# Patient Record
Sex: Female | Born: 1950 | Race: White | Hispanic: No | Marital: Married | Smoking: Never smoker
Health system: Southern US, Community
[De-identification: ages and names within clinical notes are randomized; demographics above are authoritative.]

## PROBLEM LIST (undated history)

## (undated) DIAGNOSIS — E785 Hyperlipidemia, unspecified: Secondary | ICD-10-CM

## (undated) DIAGNOSIS — M1990 Unspecified osteoarthritis, unspecified site: Secondary | ICD-10-CM

## (undated) DIAGNOSIS — I10 Essential (primary) hypertension: Secondary | ICD-10-CM

## (undated) DIAGNOSIS — F419 Anxiety disorder, unspecified: Secondary | ICD-10-CM

## (undated) DIAGNOSIS — F32A Depression, unspecified: Secondary | ICD-10-CM

## (undated) DIAGNOSIS — K589 Irritable bowel syndrome without diarrhea: Secondary | ICD-10-CM

## (undated) HISTORY — PX: ABDOMINAL HYSTERECTOMY: SHX81

## (undated) HISTORY — PX: OTHER SURGICAL HISTORY: SHX169

---

## 1989-04-14 HISTORY — PX: BREAST EXCISIONAL BIOPSY: SUR124

## 2004-12-31 ENCOUNTER — Ambulatory Visit: Payer: Self-pay | Admitting: Internal Medicine

## 2006-01-19 ENCOUNTER — Ambulatory Visit: Payer: Self-pay | Admitting: Internal Medicine

## 2006-04-03 ENCOUNTER — Ambulatory Visit: Payer: Self-pay | Admitting: Gastroenterology

## 2006-05-11 ENCOUNTER — Ambulatory Visit: Payer: Self-pay | Admitting: Gastroenterology

## 2007-01-21 ENCOUNTER — Ambulatory Visit: Payer: Self-pay | Admitting: Internal Medicine

## 2008-01-24 ENCOUNTER — Ambulatory Visit: Payer: Self-pay | Admitting: Internal Medicine

## 2008-05-14 ENCOUNTER — Emergency Department: Payer: Self-pay | Admitting: Emergency Medicine

## 2009-05-30 ENCOUNTER — Ambulatory Visit: Payer: Self-pay | Admitting: Internal Medicine

## 2009-08-06 ENCOUNTER — Ambulatory Visit: Payer: Self-pay | Admitting: Gastroenterology

## 2009-08-07 ENCOUNTER — Ambulatory Visit: Payer: Self-pay | Admitting: Gastroenterology

## 2009-08-15 ENCOUNTER — Ambulatory Visit: Payer: Self-pay | Admitting: Gastroenterology

## 2010-01-28 ENCOUNTER — Encounter: Admission: RE | Admit: 2010-01-28 | Discharge: 2010-01-28 | Payer: Self-pay | Admitting: Orthopedic Surgery

## 2010-01-29 ENCOUNTER — Encounter: Admission: RE | Admit: 2010-01-29 | Discharge: 2010-01-29 | Payer: Self-pay | Admitting: Orthopedic Surgery

## 2010-02-04 ENCOUNTER — Encounter: Admission: RE | Admit: 2010-02-04 | Discharge: 2010-02-04 | Payer: Self-pay | Admitting: Orthopedic Surgery

## 2010-02-18 ENCOUNTER — Encounter: Admission: RE | Admit: 2010-02-18 | Discharge: 2010-02-18 | Payer: Self-pay | Admitting: Orthopedic Surgery

## 2010-05-20 ENCOUNTER — Other Ambulatory Visit: Payer: Self-pay | Admitting: Orthopedic Surgery

## 2010-05-20 DIAGNOSIS — M542 Cervicalgia: Secondary | ICD-10-CM

## 2010-05-22 ENCOUNTER — Ambulatory Visit
Admission: RE | Admit: 2010-05-22 | Discharge: 2010-05-22 | Disposition: A | Discharge status: Home / Self Care | Payer: Private Health Insurance - Indemnity | Source: Ambulatory Visit | Attending: Orthopedic Surgery | Admitting: Orthopedic Surgery

## 2010-05-22 DIAGNOSIS — M542 Cervicalgia: Secondary | ICD-10-CM

## 2010-06-10 ENCOUNTER — Ambulatory Visit: Payer: Self-pay | Admitting: Internal Medicine

## 2010-08-01 ENCOUNTER — Other Ambulatory Visit: Payer: Self-pay | Admitting: Orthopedic Surgery

## 2010-08-01 DIAGNOSIS — M5410 Radiculopathy, site unspecified: Secondary | ICD-10-CM

## 2010-08-05 ENCOUNTER — Ambulatory Visit
Admission: RE | Admit: 2010-08-05 | Discharge: 2010-08-05 | Disposition: A | Discharge status: Home / Self Care | Payer: Private Health Insurance - Indemnity | Source: Ambulatory Visit | Attending: Orthopedic Surgery | Admitting: Orthopedic Surgery

## 2010-08-05 ENCOUNTER — Other Ambulatory Visit: Payer: Self-pay | Admitting: Orthopedic Surgery

## 2010-08-05 DIAGNOSIS — M5410 Radiculopathy, site unspecified: Secondary | ICD-10-CM

## 2010-08-30 ENCOUNTER — Other Ambulatory Visit: Payer: Private Health Insurance - Indemnity

## 2010-10-24 ENCOUNTER — Ambulatory Visit
Admission: RE | Admit: 2010-10-24 | Discharge: 2010-10-24 | Disposition: A | Discharge status: Home / Self Care | Payer: Private Health Insurance - Indemnity | Source: Ambulatory Visit | Attending: Orthopedic Surgery | Admitting: Orthopedic Surgery

## 2010-10-24 VITALS — BP 133/70 | HR 61

## 2010-10-24 DIAGNOSIS — M5410 Radiculopathy, site unspecified: Secondary | ICD-10-CM

## 2010-12-20 ENCOUNTER — Other Ambulatory Visit: Payer: Self-pay | Admitting: Gastroenterology

## 2011-01-08 ENCOUNTER — Ambulatory Visit: Payer: Self-pay | Admitting: Gastroenterology

## 2011-07-29 ENCOUNTER — Ambulatory Visit: Payer: Self-pay | Admitting: Internal Medicine

## 2012-08-30 ENCOUNTER — Ambulatory Visit: Payer: Self-pay | Admitting: Internal Medicine

## 2013-11-05 ENCOUNTER — Emergency Department: Payer: Self-pay | Admitting: Internal Medicine

## 2013-11-05 LAB — BASIC METABOLIC PANEL
ANION GAP: 7 (ref 7–16)
BUN: 16 mg/dL (ref 7–18)
CALCIUM: 9.3 mg/dL (ref 8.5–10.1)
CHLORIDE: 104 mmol/L (ref 98–107)
CREATININE: 0.91 mg/dL (ref 0.60–1.30)
Co2: 26 mmol/L (ref 21–32)
GLUCOSE: 93 mg/dL (ref 65–99)
Osmolality: 275 (ref 275–301)
POTASSIUM: 3.7 mmol/L (ref 3.5–5.1)
Sodium: 137 mmol/L (ref 136–145)

## 2013-11-05 LAB — CBC
HCT: 38.9 % (ref 35.0–47.0)
HGB: 12.6 g/dL (ref 12.0–16.0)
MCH: 29.1 pg (ref 26.0–34.0)
MCHC: 32.5 g/dL (ref 32.0–36.0)
MCV: 90 fL (ref 80–100)
PLATELETS: 247 10*3/uL (ref 150–440)
RBC: 4.34 10*6/uL (ref 3.80–5.20)
RDW: 13 % (ref 11.5–14.5)
WBC: 8.3 10*3/uL (ref 3.6–11.0)

## 2013-11-05 LAB — TROPONIN I: Troponin-I: <0.02 ng/mL

## 2013-11-05 LAB — PRO B NATRIURETIC PEPTIDE: B-TYPE NATIURETIC PEPTID: 113 pg/mL (ref 0–125)

## 2013-11-08 ENCOUNTER — Ambulatory Visit: Payer: Self-pay | Admitting: Family Medicine

## 2013-11-08 DIAGNOSIS — I10 Essential (primary) hypertension: Secondary | ICD-10-CM | POA: Insufficient documentation

## 2013-11-08 DIAGNOSIS — K8020 Calculus of gallbladder without cholecystitis without obstruction: Secondary | ICD-10-CM | POA: Insufficient documentation

## 2014-03-21 DIAGNOSIS — F32A Depression, unspecified: Secondary | ICD-10-CM | POA: Insufficient documentation

## 2014-08-31 ENCOUNTER — Other Ambulatory Visit: Payer: Self-pay | Admitting: Family Medicine

## 2014-08-31 DIAGNOSIS — Z1231 Encounter for screening mammogram for malignant neoplasm of breast: Secondary | ICD-10-CM

## 2014-09-13 ENCOUNTER — Other Ambulatory Visit: Payer: Self-pay | Admitting: Family Medicine

## 2014-09-13 ENCOUNTER — Ambulatory Visit
Admission: RE | Admit: 2014-09-13 | Discharge: 2014-09-13 | Disposition: A | Discharge status: Home / Self Care | Payer: Medicare Other | Source: Ambulatory Visit | Attending: Family Medicine | Admitting: Family Medicine

## 2014-09-13 DIAGNOSIS — Z1231 Encounter for screening mammogram for malignant neoplasm of breast: Secondary | ICD-10-CM

## 2014-10-07 ENCOUNTER — Other Ambulatory Visit
Admission: RE | Admit: 2014-10-07 | Discharge: 2014-10-07 | Disposition: A | Discharge status: Home / Self Care | Payer: Medicare Other | Source: Other Acute Inpatient Hospital | Attending: Gastroenterology | Admitting: Gastroenterology

## 2014-10-07 DIAGNOSIS — R194 Change in bowel habit: Secondary | ICD-10-CM | POA: Diagnosis present

## 2014-10-07 LAB — C DIFFICILE QUICK SCREEN W PCR REFLEX
C DIFFICILE (CDIFF) TOXIN: NEGATIVE
C DIFFICLE (CDIFF) ANTIGEN: NEGATIVE
C Diff interpretation: NEGATIVE

## 2014-10-09 LAB — STOOL CULTURE

## 2014-10-12 ENCOUNTER — Encounter: Payer: Self-pay | Admitting: *Deleted

## 2014-10-13 ENCOUNTER — Ambulatory Visit: Payer: Medicare Other | Admitting: Anesthesiology

## 2014-10-13 ENCOUNTER — Encounter
Admission: RE | Disposition: A | Discharge status: Home / Self Care | Payer: Self-pay | Source: Ambulatory Visit | Attending: Gastroenterology

## 2014-10-13 ENCOUNTER — Ambulatory Visit
Admission: RE | Admit: 2014-10-13 | Discharge: 2014-10-13 | Disposition: A | Discharge status: Home / Self Care | Payer: Medicare Other | Source: Ambulatory Visit | Attending: Gastroenterology | Admitting: Gastroenterology

## 2014-10-13 DIAGNOSIS — R1084 Generalized abdominal pain: Secondary | ICD-10-CM | POA: Diagnosis not present

## 2014-10-13 DIAGNOSIS — K625 Hemorrhage of anus and rectum: Secondary | ICD-10-CM | POA: Insufficient documentation

## 2014-10-13 DIAGNOSIS — Z79899 Other long term (current) drug therapy: Secondary | ICD-10-CM | POA: Diagnosis not present

## 2014-10-13 DIAGNOSIS — R197 Diarrhea, unspecified: Secondary | ICD-10-CM | POA: Insufficient documentation

## 2014-10-13 DIAGNOSIS — K644 Residual hemorrhoidal skin tags: Secondary | ICD-10-CM | POA: Diagnosis not present

## 2014-10-13 DIAGNOSIS — I10 Essential (primary) hypertension: Secondary | ICD-10-CM | POA: Diagnosis not present

## 2014-10-13 DIAGNOSIS — K5730 Diverticulosis of large intestine without perforation or abscess without bleeding: Secondary | ICD-10-CM | POA: Insufficient documentation

## 2014-10-13 DIAGNOSIS — Z6841 Body Mass Index (BMI) 40.0 and over, adult: Secondary | ICD-10-CM | POA: Insufficient documentation

## 2014-10-13 DIAGNOSIS — Z885 Allergy status to narcotic agent status: Secondary | ICD-10-CM | POA: Diagnosis not present

## 2014-10-13 DIAGNOSIS — K648 Other hemorrhoids: Secondary | ICD-10-CM | POA: Insufficient documentation

## 2014-10-13 DIAGNOSIS — E669 Obesity, unspecified: Secondary | ICD-10-CM | POA: Insufficient documentation

## 2014-10-13 HISTORY — PX: COLONOSCOPY: SHX5424

## 2014-10-13 SURGERY — COLONOSCOPY
Anesthesia: General

## 2014-10-13 MED ORDER — PROPOFOL 10 MG/ML IV BOLUS
INTRAVENOUS | Status: DC | PRN
  Administered 2014-10-13: 40 mg via INTRAVENOUS

## 2014-10-13 MED ORDER — LIDOCAINE HCL (PF) 1 % IJ SOLN
2.0000 mL | Freq: Once | INTRAMUSCULAR | Status: AC
  Administered 2014-10-13: 0.3 mL via INTRADERMAL

## 2014-10-13 MED ORDER — PROPOFOL INFUSION 10 MG/ML OPTIME
INTRAVENOUS | Status: DC | PRN
  Administered 2014-10-13: 140 ug/kg/min via INTRAVENOUS

## 2014-10-13 MED ORDER — LIDOCAINE HCL (PF) 1 % IJ SOLN
INTRAMUSCULAR | Status: AC
  Administered 2014-10-13: 0.3 mL via INTRADERMAL
  Filled 2014-10-13: qty 2

## 2014-10-13 MED ORDER — SODIUM CHLORIDE 0.9 % IV SOLN
INTRAVENOUS | Status: DC
  Administered 2014-10-13: 1000 mL via INTRAVENOUS

## 2014-10-13 MED ORDER — LIDOCAINE HCL (CARDIAC) 20 MG/ML IV SOLN
INTRAVENOUS | Status: DC | PRN
  Administered 2014-10-13: 60 mg via INTRAVENOUS

## 2014-10-13 MED ORDER — EPHEDRINE SULFATE 50 MG/ML IJ SOLN
INTRAMUSCULAR | Status: DC | PRN
  Administered 2014-10-13 (×2): 5 mg via INTRAVENOUS

## 2014-10-13 NOTE — Anesthesia Postprocedure Evaluation (Signed)
  Anesthesia Post-op Note  Patient: Laurie Pennington  Procedure(s) Performed: Procedure(s): COLONOSCOPY (N/A)  Anesthesia type:General  Patient location: PACU  Post pain: Pain level controlled  Post assessment: Post-op Vital signs reviewed, Patient's Cardiovascular Status Stable, Respiratory Function Stable, Patent Airway and No signs of Nausea or vomiting  Post vital signs: Reviewed and stable  Last Vitals:  Filed Vitals:   10/13/14 1618  BP: 129/68  Pulse: 61  Temp:   Resp: 20    Level of consciousness: awake, alert  and patient cooperative  Complications: No apparent anesthesia complications

## 2014-10-13 NOTE — H&P (Signed)
Outpatient short stay form Pre-procedure 10/13/2014 3:01 PM Laurie Pennington Laurie Laurie Drakeford MD  Primary Physician: Dr. Ether GriffinsFowler  Reason for visit:  Colonoscopy  History of present illness:  Change of bowel habits, bloody stools, mucousy stools. Diarrhea. Patient is 64 year old female presenting with above complaints. He does have a history of colonoscopy done in January 2008 with finding of hyperplastic colon polyps. He has had a remote history of ischemic colitis. I saw her on 10/04/2014. She has been having some episodic rectal bleeding and some change of bowel habits with looser stools. States she has done well since I saw her in the outpatient clinic. He tolerated her prep well. She does not take anticoagulates or aspirin products.    Current facility-administered medications:  .  0.9 %  sodium chloride infusion, , Intravenous, Continuous, Laurie Pennington Laurie [name], MD, Last Rate: 20 mL/hr at 10/13/14 1431, 1,000 mL at 10/13/14 1431  Prescriptions prior to admission  Medication Sig Dispense Refill Last Dose  . desonide (DESOWEN) 0.05 % cream Apply topically 2 (two) times daily.     Marland Kitchen. dicyclomine (BENTYL) 20 MG tablet Take 20 mg by mouth every 6 (six) hours.     Marland Kitchen. escitalopram (LEXAPRO) 10 MG tablet Take 10 mg by mouth daily.     . hydrochlorothiazide (HYDRODIURIL) 25 MG tablet Take 25 mg by mouth daily.     Marland Kitchen. LORazepam (ATIVAN) 0.5 MG tablet Take 0.5 mg by mouth.     . meloxicam (MOBIC) 15 MG tablet Take 15 mg by mouth daily.     Marland Kitchen. omeprazole (PRILOSEC) 40 MG capsule Take 40 mg by mouth daily.     . propranolol ER (INDERAL LA) 120 MG 24 hr capsule Take 120 mg by mouth daily.   10/12/2014 at Unknown time     Allergies  Allergen Reactions  . Percocet [Oxycodone-Acetaminophen] Nausea And Vomiting     No past medical history on file.  Review of systems:      Physical Exam    Heart and lungs: Regular rate and rhythm without rub or gallop lungs bilaterally clear    HEENT: Normocephalic atraumatic eyes  are anicteric    Other:     Pertinant exam for procedure: Soft nontender nondistended bowel sounds positive normoactive    Planned proceedures: Colonoscopy and indicated procedures I have discussed the risks benefits and complications of procedures to include not limited to bleeding, infection, perforation and the risk of sedation and the patient wishes to proceed.    Laurie Pennington Laurie Vani Gunner, MD Gastroenterology 10/13/2014  3:01 PM

## 2014-10-13 NOTE — Op Note (Signed)
[facility] Gastroenterology Patient Name: Laurie Pennington Procedure Date: 10/13/2014 3:11 PM MRN: [id number] Account #: [id number] Date of Birth: 1950-10-05 Admit Type: Outpatient Age: 65 Room: [facility] ENDO ROOM 1 Gender: Female Note Status: Finalized Procedure:         Colonoscopy Indications:       Generalized abdominal pain, Diarrhea, Rectal bleeding Providers:         Christena Deem, MD Referring MD:      Clelia Croft. Ingledue (Referring MD) Medicines:         Monitored Anesthesia Care Complications:     No immediate complications. Procedure:         Pre-Anesthesia Assessment:                    - ASA Grade Assessment: III - A patient with severe                     systemic disease.                    After obtaining informed consent, the colonoscope was                     passed under direct vision. Throughout the procedure, the                     patient's blood pressure, pulse, and oxygen saturations                     were monitored continuously. The Colonoscope was                     introduced through the anus and advanced to the the cecum,                     identified by appendiceal orifice and ileocecal valve. The                     colonoscopy was performed with moderate difficulty due to                     a tortuous colon. Successful completion of the procedure                     was aided by changing the patient to a supine position and                     using manual pressure. The patient tolerated the procedure                     well. The quality of the bowel preparation was good. Findings:      Multiple small and large-mouthed diverticula were found in the sigmoid       colon and in the descending colon.      The sigmoid colon and descending colon were significantly tortuous.      External and internal hemorrhoids were found during perianal exam and       during anoscopy. The hemorrhoids were small.      Biopsies for histology were  taken with a cold forceps from the right       colon and left colon for evaluation of microscopic colitis.      The digital rectal exam was normal otherwise. Impression:        -  Diverticulosis in the sigmoid colon and in the                     descending colon.                    - Tortuous colon.                    - External and internal hemorrhoids.                    - Biopsies were taken with a cold forceps from the right                     colon and left colon for evaluation of microscopic colitis. Recommendation:    - Discharge patient to home.                    V[employer]- [employer] colon health one capsule daily.                    - Return to GI clinic in 6 weeks. Procedure Code(s): --- Professional ---                    [id number], Colonoscopy, flexible; with biopsy, single or                     multiple Diagnosis Code(s): --- Professional ---                    455.0, Internal hemorrhoids without mention of complication                    455.3, External hemorrhoids without mention of complication                    789.07, Abdominal pain, generalized                    787.91, Diarrhea                    569.3, Hemorrhage of rectum and anus                    562.10, Diverticulosis of colon (without mention of                     hemorrhage)                    751.5, Other anomalies of intestine CPT copyright 2014 [employer]. All rights reserved. The codes documented in this report are preliminary and upon coder review may  be revised to meet current compliance requirements. Christena DeemMartin U Genetta Fiero, MD 10/13/2014 3:50:58 PM This report has been signed electronically. Number of Addenda: 0 Note Initiated On: 10/13/2014 3:11 PM Scope Withdrawal Time: 0 hours 7 minutes 14 seconds  Total Procedure Duration: 0 hours 24 minutes 10 seconds       [facility]

## 2014-10-13 NOTE — Anesthesia Preprocedure Evaluation (Signed)
Anesthesia Evaluation  Patient identified by MRN, date of birth, ID band Patient awake    Reviewed: Allergy & Precautions, NPO status , Patient's Chart, lab work & pertinent test results  History of Anesthesia Complications Negative for: history of anesthetic complications  Airway Mallampati: III       Dental  (+) Teeth Intact   Pulmonary neg pulmonary ROS,    Pulmonary exam normal       Cardiovascular hypertension, Pt. on medications Normal cardiovascular exam    Neuro/Psych negative neurological ROS  negative psych ROS   GI/Hepatic negative GI ROS, Neg liver ROS,   Endo/Other  negative endocrine ROS  Renal/GU negative Renal ROS  negative genitourinary   Musculoskeletal negative musculoskeletal ROS (+)   Abdominal (+) + obese,   Peds negative pediatric ROS (+)  Hematology negative hematology ROS (+)   Anesthesia Other Findings   Reproductive/Obstetrics negative OB ROS                             Anesthesia Physical Anesthesia Plan  ASA: III  Anesthesia Plan: General   Post-op Pain Management:    Induction: Intravenous  Airway Management Planned: Nasal Cannula  Additional Equipment:   Intra-op Plan:   Post-operative Plan:   Informed Consent: I have reviewed the patients History and Physical, chart, labs and discussed the procedure including the risks, benefits and alternatives for the proposed anesthesia with the patient or authorized representative who has indicated his/her understanding and acceptance.     Plan Discussed with: CRNA  Anesthesia Plan Comments:         Anesthesia Quick Evaluation

## 2014-10-13 NOTE — Transfer of Care (Signed)
Immediate Anesthesia Transfer of Care Note  Patient: Laurie Pennington  Procedure(s) Performed: Procedure(s): COLONOSCOPY (N/A)  Patient Location: Endoscopy Unit  Anesthesia Type:General  Level of Consciousness: awake, alert , oriented and patient cooperative  Airway & Oxygen Therapy: Patient Spontanous Breathing and Patient connected to nasal cannula oxygen  Post-op Assessment: Report given to RN, Post -op Vital signs reviewed and stable and Patient moving all extremities X 4  Post vital signs: Reviewed and stable  Last Vitals:  Filed Vitals:   10/13/14 1549  BP: 120/68  Pulse: 64  Temp: 36.1 C  Resp: 12    Complications: No apparent anesthesia complications

## 2014-10-17 ENCOUNTER — Encounter: Payer: Self-pay | Admitting: Gastroenterology

## 2014-10-18 LAB — SURGICAL PATHOLOGY

## 2014-10-24 DIAGNOSIS — E039 Hypothyroidism, unspecified: Secondary | ICD-10-CM | POA: Insufficient documentation

## 2014-10-24 DIAGNOSIS — F419 Anxiety disorder, unspecified: Secondary | ICD-10-CM | POA: Insufficient documentation

## 2014-10-24 DIAGNOSIS — K581 Irritable bowel syndrome with constipation: Secondary | ICD-10-CM | POA: Insufficient documentation

## 2014-10-24 DIAGNOSIS — R7302 Impaired glucose tolerance (oral): Secondary | ICD-10-CM | POA: Insufficient documentation

## 2015-08-07 ENCOUNTER — Other Ambulatory Visit: Payer: Self-pay | Admitting: Family Medicine

## 2015-08-07 DIAGNOSIS — Z780 Asymptomatic menopausal state: Secondary | ICD-10-CM

## 2015-08-27 ENCOUNTER — Other Ambulatory Visit: Payer: Self-pay | Admitting: Family Medicine

## 2015-08-27 DIAGNOSIS — Z1231 Encounter for screening mammogram for malignant neoplasm of breast: Secondary | ICD-10-CM

## 2015-10-01 ENCOUNTER — Other Ambulatory Visit: Payer: Self-pay | Admitting: Family Medicine

## 2015-10-01 ENCOUNTER — Ambulatory Visit
Admission: RE | Admit: 2015-10-01 | Discharge: 2015-10-01 | Disposition: A | Discharge status: Home / Self Care | Payer: Medicare Other | Source: Ambulatory Visit | Attending: Family Medicine | Admitting: Family Medicine

## 2015-10-01 DIAGNOSIS — Z1231 Encounter for screening mammogram for malignant neoplasm of breast: Secondary | ICD-10-CM

## 2015-10-01 DIAGNOSIS — Z780 Asymptomatic menopausal state: Secondary | ICD-10-CM | POA: Diagnosis present

## 2015-12-13 ENCOUNTER — Other Ambulatory Visit: Payer: Self-pay | Admitting: Family Medicine

## 2015-12-13 DIAGNOSIS — Z780 Asymptomatic menopausal state: Secondary | ICD-10-CM

## 2016-06-06 DIAGNOSIS — E7800 Pure hypercholesterolemia, unspecified: Secondary | ICD-10-CM | POA: Insufficient documentation

## 2016-06-27 IMAGING — MG MM DIGITAL SCREENING BILAT W/ TOMO W/ CAD
8 of 16 series · 8 of 36 positions shown · non-contrast
Comparison: Previous exam(s).

CLINICAL DATA: Screening.

EXAM:
2D DIGITAL SCREENING BILATERAL MAMMOGRAM WITH CAD AND ADJUNCT TOMO

[L MLO (1 of 2)]
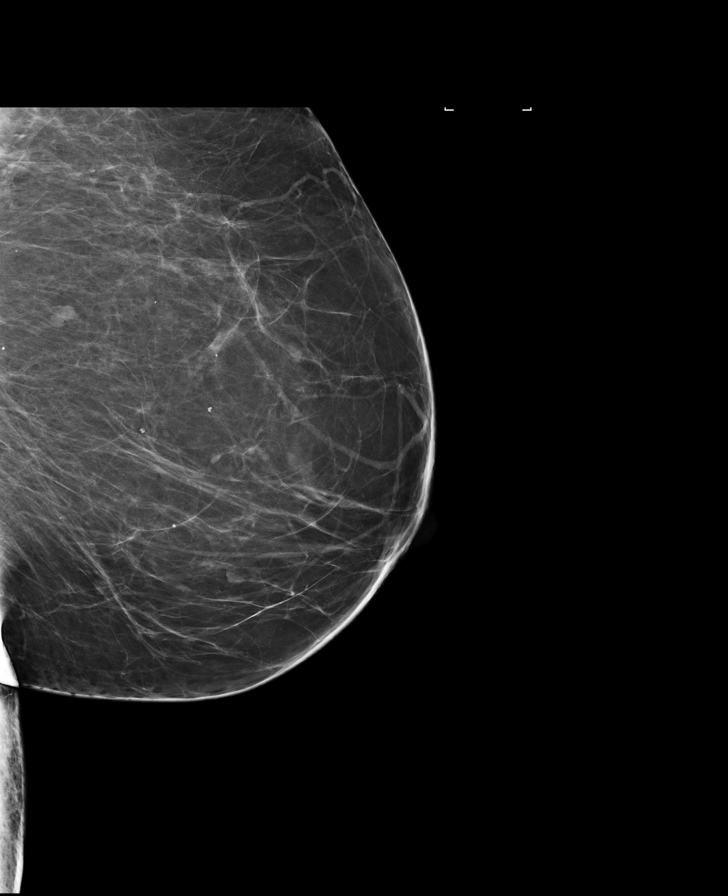

[L MLO synth-2D]
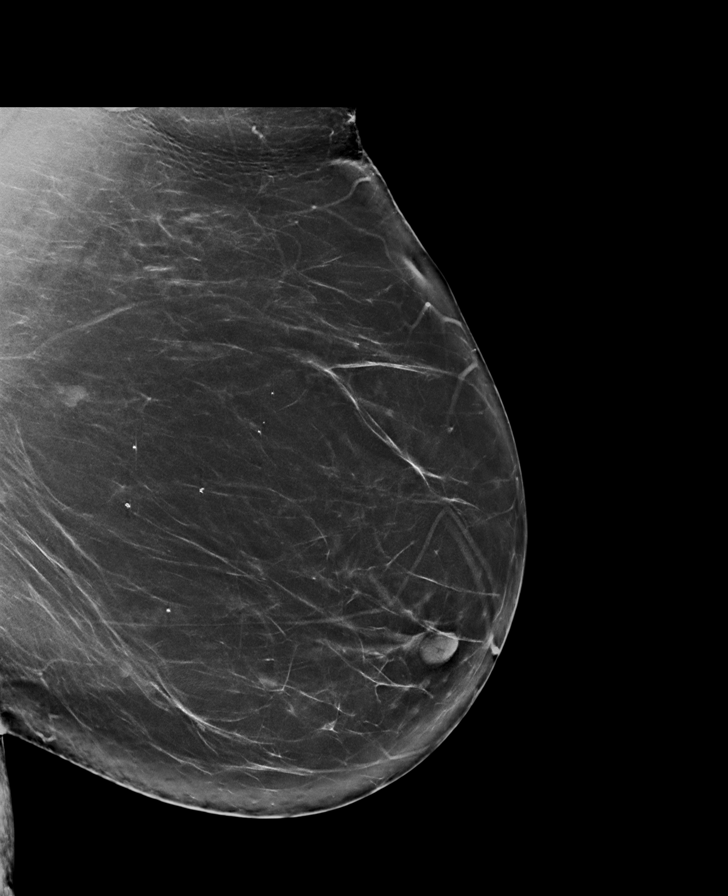

[L CC synth-2D]
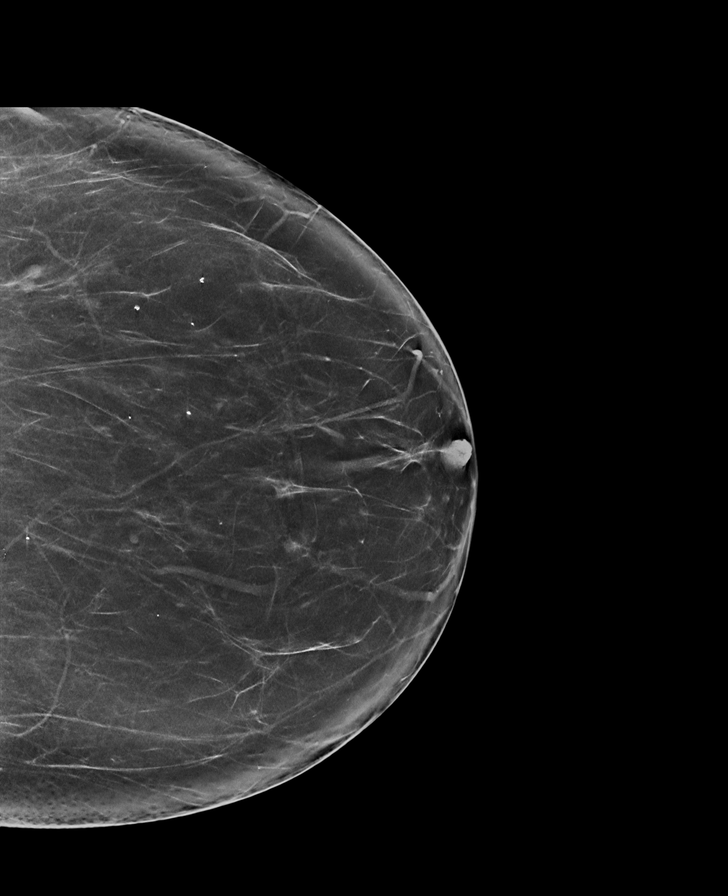

[R MLO]
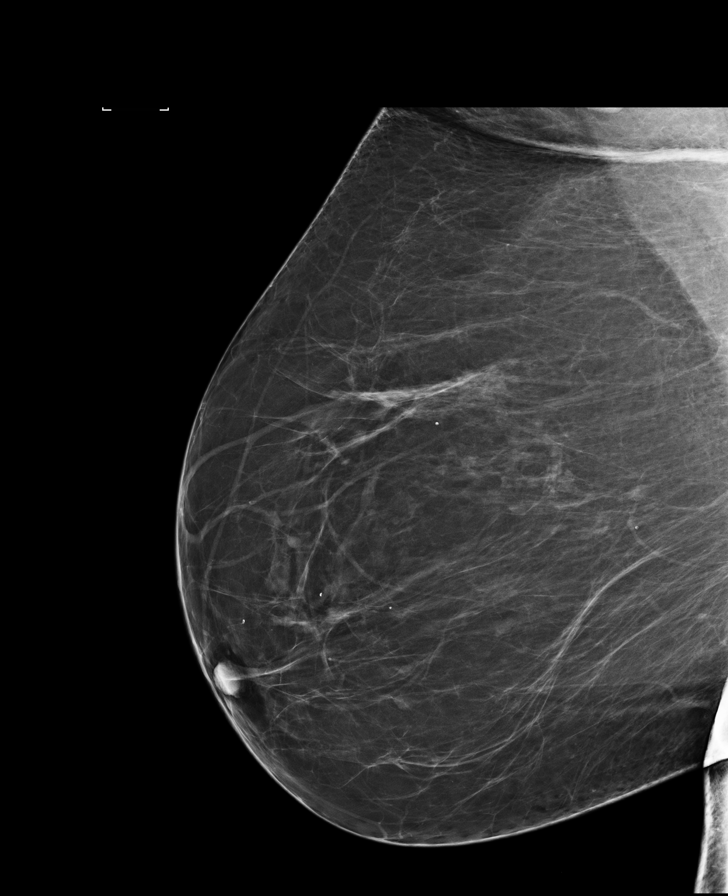

[R CC synth-2D (1 of 2)]
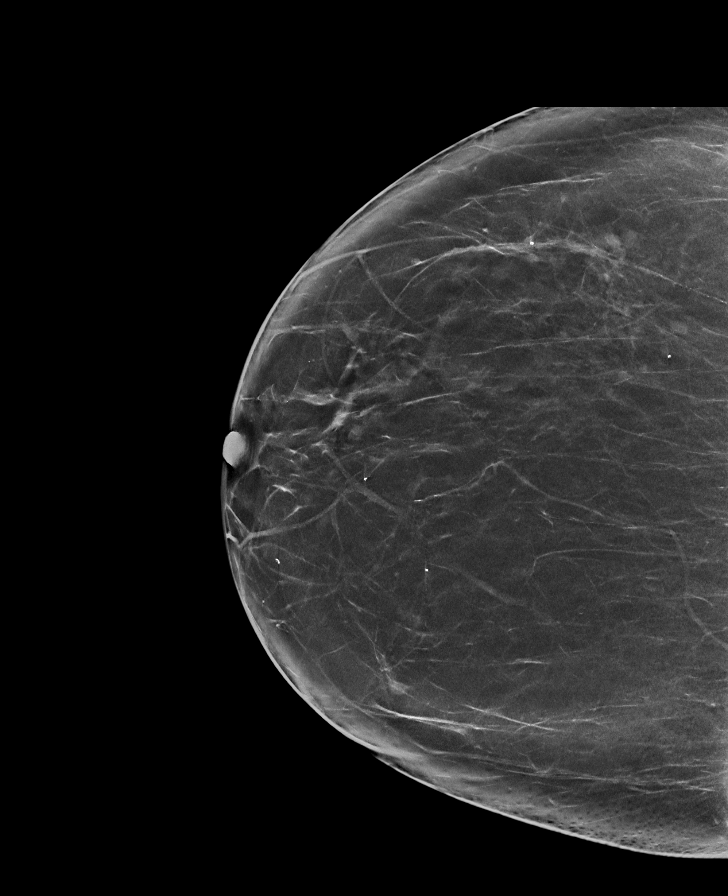

[R CC synth-2D (2 of 2)]
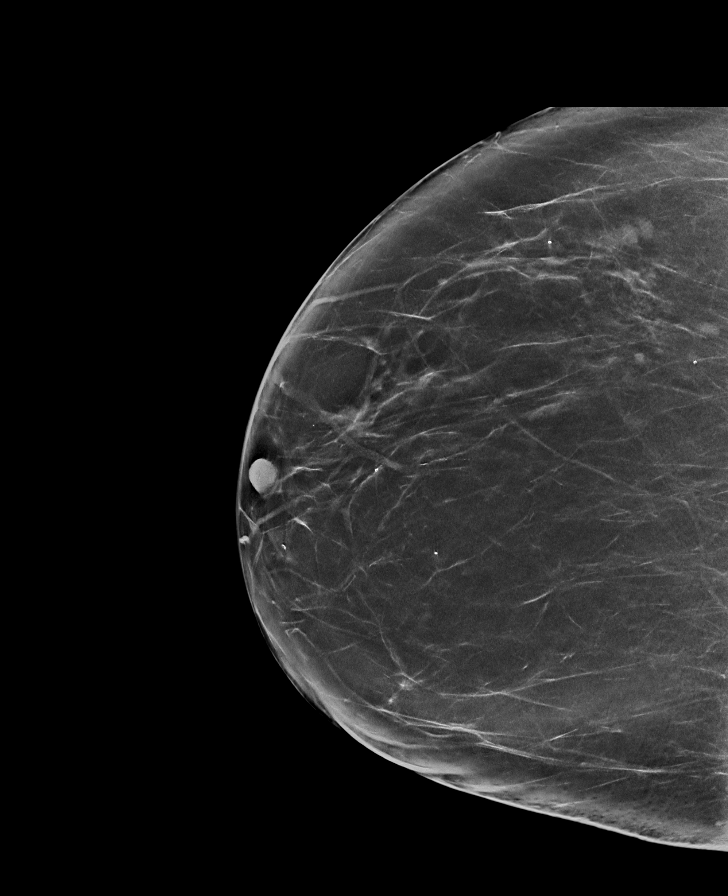

[R MLO synth-2D]
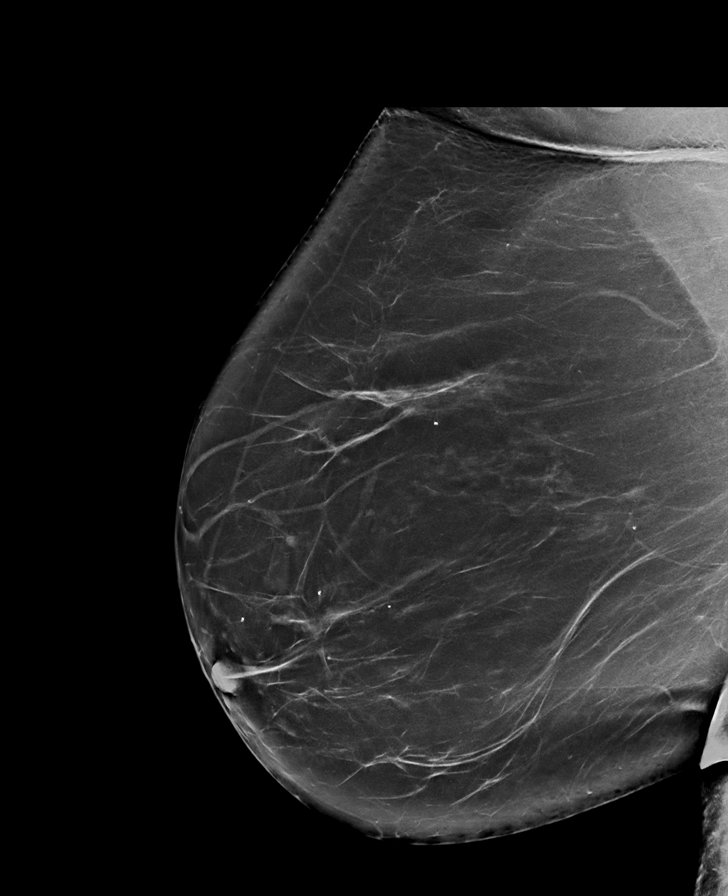

[L MLO (2 of 2)]
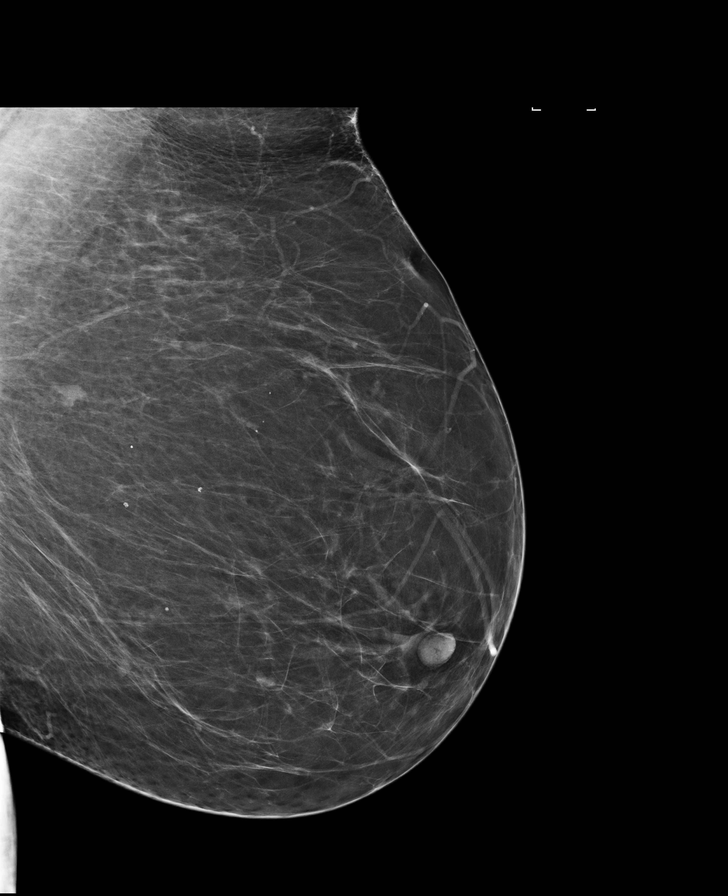

[8 of 36 positions shown; findings below may reference images not displayed]

ACR Breast Density Category b: There are scattered areas of
fibroglandular density.
FINDINGS: There are no findings suspicious for malignancy. Images were
processed with CAD.
IMPRESSION: No mammographic evidence of malignancy. A result letter of this
screening mammogram will be mailed directly to the patient.

RECOMMENDATION:
Screening mammogram in one year. (Code:97-6-RS4)

BI-RADS CATEGORY  1: Negative.

## 2016-09-17 ENCOUNTER — Other Ambulatory Visit: Payer: Self-pay | Admitting: Family Medicine

## 2016-09-17 DIAGNOSIS — Z1231 Encounter for screening mammogram for malignant neoplasm of breast: Secondary | ICD-10-CM

## 2016-10-06 ENCOUNTER — Ambulatory Visit
Admission: RE | Admit: 2016-10-06 | Discharge: 2016-10-06 | Disposition: A | Discharge status: Home / Self Care | Payer: Medicare Other | Source: Ambulatory Visit | Attending: Family Medicine | Admitting: Family Medicine

## 2016-10-06 DIAGNOSIS — Z1231 Encounter for screening mammogram for malignant neoplasm of breast: Secondary | ICD-10-CM

## 2017-08-04 ENCOUNTER — Other Ambulatory Visit: Payer: Self-pay | Admitting: Family Medicine

## 2017-08-04 DIAGNOSIS — Z1239 Encounter for other screening for malignant neoplasm of breast: Secondary | ICD-10-CM

## 2017-09-29 ENCOUNTER — Other Ambulatory Visit: Payer: Self-pay | Admitting: Family Medicine

## 2017-09-30 ENCOUNTER — Other Ambulatory Visit: Payer: Self-pay | Admitting: Family Medicine

## 2017-09-30 DIAGNOSIS — R102 Pelvic and perineal pain: Secondary | ICD-10-CM

## 2017-09-30 DIAGNOSIS — R42 Dizziness and giddiness: Secondary | ICD-10-CM

## 2017-10-07 ENCOUNTER — Ambulatory Visit
Admission: RE | Admit: 2017-10-07 | Discharge: 2017-10-07 | Disposition: A | Discharge status: Home / Self Care | Payer: Medicare Other | Source: Ambulatory Visit | Attending: Family Medicine | Admitting: Family Medicine

## 2017-10-07 DIAGNOSIS — Z1231 Encounter for screening mammogram for malignant neoplasm of breast: Secondary | ICD-10-CM | POA: Insufficient documentation

## 2017-10-07 DIAGNOSIS — Z1239 Encounter for other screening for malignant neoplasm of breast: Secondary | ICD-10-CM

## 2017-11-05 ENCOUNTER — Ambulatory Visit
Admission: RE | Admit: 2017-11-05 | Discharge: 2017-11-05 | Disposition: A | Discharge status: Home / Self Care | Payer: Medicare Other | Source: Ambulatory Visit | Attending: Family Medicine | Admitting: Family Medicine

## 2017-11-05 DIAGNOSIS — R102 Pelvic and perineal pain: Secondary | ICD-10-CM | POA: Insufficient documentation

## 2017-11-05 DIAGNOSIS — R42 Dizziness and giddiness: Secondary | ICD-10-CM | POA: Insufficient documentation

## 2017-11-05 DIAGNOSIS — Z90710 Acquired absence of both cervix and uterus: Secondary | ICD-10-CM | POA: Diagnosis not present

## 2018-10-25 ENCOUNTER — Other Ambulatory Visit: Payer: Self-pay | Admitting: Family Medicine

## 2018-10-25 DIAGNOSIS — Z1231 Encounter for screening mammogram for malignant neoplasm of breast: Secondary | ICD-10-CM

## 2018-11-20 ENCOUNTER — Ambulatory Visit (INDEPENDENT_AMBULATORY_CARE_PROVIDER_SITE_OTHER)
Admission: EM | Admit: 2018-11-20 | Discharge: 2018-11-20 | Disposition: A | Discharge status: Home / Self Care | Payer: Medicare HMO | Source: Home / Self Care | Attending: Family Medicine | Admitting: Family Medicine

## 2018-11-20 ENCOUNTER — Inpatient Hospital Stay
Admission: EM | Admit: 2018-11-20 | Discharge: 2018-11-22 | DRG: 392 | Disposition: A | Discharge status: Home / Self Care | Payer: Medicare HMO | Attending: Specialist | Admitting: Specialist

## 2018-11-20 ENCOUNTER — Encounter: Payer: Self-pay | Admitting: Emergency Medicine

## 2018-11-20 ENCOUNTER — Other Ambulatory Visit: Payer: Self-pay

## 2018-11-20 ENCOUNTER — Emergency Department: Payer: Medicare HMO

## 2018-11-20 DIAGNOSIS — Z888 Allergy status to other drugs, medicaments and biological substances status: Secondary | ICD-10-CM

## 2018-11-20 DIAGNOSIS — F329 Major depressive disorder, single episode, unspecified: Secondary | ICD-10-CM | POA: Diagnosis present

## 2018-11-20 DIAGNOSIS — R197 Diarrhea, unspecified: Secondary | ICD-10-CM

## 2018-11-20 DIAGNOSIS — E861 Hypovolemia: Secondary | ICD-10-CM | POA: Diagnosis present

## 2018-11-20 DIAGNOSIS — A059 Bacterial foodborne intoxication, unspecified: Principal | ICD-10-CM | POA: Diagnosis present

## 2018-11-20 DIAGNOSIS — N179 Acute kidney failure, unspecified: Secondary | ICD-10-CM | POA: Diagnosis present

## 2018-11-20 DIAGNOSIS — E860 Dehydration: Secondary | ICD-10-CM

## 2018-11-20 DIAGNOSIS — Z885 Allergy status to narcotic agent status: Secondary | ICD-10-CM | POA: Diagnosis not present

## 2018-11-20 DIAGNOSIS — R112 Nausea with vomiting, unspecified: Secondary | ICD-10-CM | POA: Diagnosis not present

## 2018-11-20 DIAGNOSIS — Z79899 Other long term (current) drug therapy: Secondary | ICD-10-CM

## 2018-11-20 DIAGNOSIS — Z6841 Body Mass Index (BMI) 40.0 and over, adult: Secondary | ICD-10-CM | POA: Diagnosis not present

## 2018-11-20 DIAGNOSIS — I9589 Other hypotension: Secondary | ICD-10-CM | POA: Diagnosis present

## 2018-11-20 DIAGNOSIS — E669 Obesity, unspecified: Secondary | ICD-10-CM | POA: Diagnosis present

## 2018-11-20 DIAGNOSIS — E785 Hyperlipidemia, unspecified: Secondary | ICD-10-CM | POA: Diagnosis present

## 2018-11-20 DIAGNOSIS — Z79890 Hormone replacement therapy (postmenopausal): Secondary | ICD-10-CM | POA: Diagnosis not present

## 2018-11-20 DIAGNOSIS — K589 Irritable bowel syndrome without diarrhea: Secondary | ICD-10-CM | POA: Diagnosis present

## 2018-11-20 DIAGNOSIS — Z20828 Contact with and (suspected) exposure to other viral communicable diseases: Secondary | ICD-10-CM | POA: Diagnosis present

## 2018-11-20 DIAGNOSIS — Z8249 Family history of ischemic heart disease and other diseases of the circulatory system: Secondary | ICD-10-CM

## 2018-11-20 DIAGNOSIS — I10 Essential (primary) hypertension: Secondary | ICD-10-CM | POA: Diagnosis present

## 2018-11-20 DIAGNOSIS — K529 Noninfective gastroenteritis and colitis, unspecified: Secondary | ICD-10-CM

## 2018-11-20 HISTORY — DX: Hyperlipidemia, unspecified: E78.5

## 2018-11-20 HISTORY — DX: Unspecified osteoarthritis, unspecified site: M19.90

## 2018-11-20 HISTORY — DX: Irritable bowel syndrome, unspecified: K58.9

## 2018-11-20 HISTORY — DX: Essential (primary) hypertension: I10

## 2018-11-20 LAB — URINALYSIS, COMPLETE (UACMP) WITH MICROSCOPIC
Bilirubin Urine: NEGATIVE
Glucose, UA: NEGATIVE mg/dL
Ketones, ur: NEGATIVE mg/dL
Leukocytes,Ua: NEGATIVE
Nitrite: NEGATIVE
Protein, ur: 30 mg/dL — AB
Specific Gravity, Urine: 1.017 (ref 1.005–1.030)
pH: 6 (ref 5.0–8.0)

## 2018-11-20 LAB — CBC WITH DIFFERENTIAL/PLATELET
Abs Immature Granulocytes: 0.06 10*3/uL (ref 0.00–0.07)
Basophils Absolute: 0.1 10*3/uL (ref 0.0–0.1)
Basophils Relative: 0 %
Eosinophils Absolute: 0.1 10*3/uL (ref 0.0–0.5)
Eosinophils Relative: 1 %
HCT: 34.7 % — ABNORMAL LOW (ref 36.0–46.0)
Hemoglobin: 11.5 g/dL — ABNORMAL LOW (ref 12.0–15.0)
Immature Granulocytes: 0 %
Lymphocytes Relative: 14 %
Lymphs Abs: 1.9 10*3/uL (ref 0.7–4.0)
MCH: 29.6 pg (ref 26.0–34.0)
MCHC: 33.1 g/dL (ref 30.0–36.0)
MCV: 89.4 fL (ref 80.0–100.0)
Monocytes Absolute: 1 10*3/uL (ref 0.1–1.0)
Monocytes Relative: 7 %
Neutro Abs: 10.4 10*3/uL — ABNORMAL HIGH (ref 1.7–7.7)
Neutrophils Relative %: 78 %
Platelets: 257 10*3/uL (ref 150–400)
RBC: 3.88 MIL/uL (ref 3.87–5.11)
RDW: 13.1 % (ref 11.5–15.5)
WBC: 13.6 10*3/uL — ABNORMAL HIGH (ref 4.0–10.5)
nRBC: 0 % (ref 0.0–0.2)

## 2018-11-20 LAB — COMPREHENSIVE METABOLIC PANEL
ALT: 16 U/L (ref 0–44)
AST: 22 U/L (ref 15–41)
Albumin: 4.4 g/dL (ref 3.5–5.0)
Alkaline Phosphatase: 75 U/L (ref 38–126)
Anion gap: 15 (ref 5–15)
BUN: 45 mg/dL — ABNORMAL HIGH (ref 8–23)
CO2: 25 mmol/L (ref 22–32)
Calcium: 10.1 mg/dL (ref 8.9–10.3)
Chloride: 99 mmol/L (ref 98–111)
Creatinine, Ser: 2.25 mg/dL — ABNORMAL HIGH (ref 0.44–1.00)
GFR calc Af Amer: 25 mL/min — ABNORMAL LOW (ref >60)
GFR calc non Af Amer: 22 mL/min — ABNORMAL LOW (ref >60)
Glucose, Bld: 136 mg/dL — ABNORMAL HIGH (ref 70–99)
Potassium: 3.8 mmol/L (ref 3.5–5.1)
Sodium: 139 mmol/L (ref 135–145)
Total Bilirubin: 0.6 mg/dL (ref 0.3–1.2)
Total Protein: 7.4 g/dL (ref 6.5–8.1)

## 2018-11-20 LAB — LIPASE, BLOOD: Lipase: 52 U/L — ABNORMAL HIGH (ref 11–51)

## 2018-11-20 LAB — GASTROINTESTINAL PANEL BY PCR, STOOL (REPLACES STOOL CULTURE)

## 2018-11-20 LAB — LACTIC ACID, PLASMA: Lactic Acid, Venous: 1.6 mmol/L (ref 0.5–1.9)

## 2018-11-20 LAB — C DIFFICILE QUICK SCREEN W PCR REFLEX
C Diff interpretation: NOT DETECTED
C Diff toxin: NEGATIVE

## 2018-11-20 LAB — C DIFFICILE QUICK SCREEN W PCR REFLEX??: C Diff antigen: NEGATIVE

## 2018-11-20 MED ORDER — ESCITALOPRAM OXALATE 10 MG PO TABS
20.0000 mg | ORAL_TABLET | Freq: Every day | ORAL | Status: DC
  Administered 2018-11-21 – 2018-11-22 (×2): 20 mg via ORAL
  Filled 2018-11-20 (×4): qty 2

## 2018-11-20 MED ORDER — ONDANSETRON HCL 4 MG/2ML IJ SOLN: 4.0000 mg | Freq: Four times a day (QID) | INTRAMUSCULAR | Status: DC | PRN

## 2018-11-20 MED ORDER — SODIUM CHLORIDE 0.9 % IV BOLUS
1000.0000 mL | Freq: Once | INTRAVENOUS | Status: AC
  Administered 2018-11-20: 1000 mL via INTRAVENOUS

## 2018-11-20 MED ORDER — VITAMIN D 25 MCG (1000 UNIT) PO TABS
2000.0000 [IU] | ORAL_TABLET | Freq: Every day | ORAL | Status: DC
  Administered 2018-11-21 – 2018-11-22 (×2): 2000 [IU] via ORAL
  Filled 2018-11-20 (×2): qty 2

## 2018-11-20 MED ORDER — DICYCLOMINE HCL 10 MG PO CAPS
10.0000 mg | ORAL_CAPSULE | Freq: Three times a day (TID) | ORAL | Status: DC | PRN
  Administered 2018-11-20 – 2018-11-21 (×2): 10 mg via ORAL
  Filled 2018-11-20 (×3): qty 1

## 2018-11-20 MED ORDER — MONTELUKAST SODIUM 10 MG PO TABS
10.0000 mg | ORAL_TABLET | Freq: Every day | ORAL | Status: DC
  Filled 2018-11-20: qty 1

## 2018-11-20 MED ORDER — SODIUM CHLORIDE 0.9 % IV SOLN
INTRAVENOUS | Status: DC
  Administered 2018-11-20 – 2018-11-22 (×3): via INTRAVENOUS

## 2018-11-20 MED ORDER — TRAMADOL HCL 50 MG PO TABS: 50.0000 mg | ORAL_TABLET | Freq: Four times a day (QID) | ORAL | Status: DC | PRN

## 2018-11-20 MED ORDER — LORAZEPAM 0.5 MG PO TABS: 0.5000 mg | ORAL_TABLET | Freq: Two times a day (BID) | ORAL | Status: DC | PRN

## 2018-11-20 MED ORDER — SODIUM CHLORIDE 0.9 % IV SOLN: Freq: Once | INTRAVENOUS | Status: DC

## 2018-11-20 MED ORDER — ENOXAPARIN SODIUM 30 MG/0.3ML ~~LOC~~ SOLN: 30.0000 mg | SUBCUTANEOUS | Status: DC

## 2018-11-20 MED ORDER — ACETAMINOPHEN 650 MG RE SUPP: 650.0000 mg | Freq: Four times a day (QID) | RECTAL | Status: DC | PRN

## 2018-11-20 MED ORDER — LEVOTHYROXINE SODIUM 25 MCG PO TABS
25.0000 ug | ORAL_TABLET | Freq: Every day | ORAL | Status: DC
  Administered 2018-11-21 – 2018-11-22 (×2): 25 ug via ORAL
  Filled 2018-11-20 (×2): qty 1

## 2018-11-20 MED ORDER — ONDANSETRON HCL 4 MG PO TABS: 4.0000 mg | ORAL_TABLET | Freq: Four times a day (QID) | ORAL | Status: DC | PRN

## 2018-11-20 MED ORDER — ROSUVASTATIN CALCIUM 10 MG PO TABS
5.0000 mg | ORAL_TABLET | Freq: Every day | ORAL | Status: DC
  Filled 2018-11-20: qty 1

## 2018-11-20 MED ORDER — ADULT MULTIVITAMIN W/MINERALS CH
1.0000 | ORAL_TABLET | Freq: Every day | ORAL | Status: DC
  Administered 2018-11-21 – 2018-11-22 (×2): 1 via ORAL
  Filled 2018-11-20 (×2): qty 1

## 2018-11-20 MED ORDER — ACETAMINOPHEN 325 MG PO TABS
650.0000 mg | ORAL_TABLET | Freq: Four times a day (QID) | ORAL | Status: DC | PRN
  Administered 2018-11-21: 650 mg via ORAL
  Filled 2018-11-20: qty 2

## 2018-11-20 MED ORDER — DICYCLOMINE HCL 10 MG PO CAPS
10.0000 mg | ORAL_CAPSULE | Freq: Once | ORAL | Status: AC
  Administered 2018-11-21: 10 mg via ORAL
  Filled 2018-11-20: qty 1

## 2018-11-20 NOTE — ED Notes (Signed)
Pt ambulatory to BR.  States she feels some more steady on her feet, but still weak.  Pt had small amount of liquid stool, sent to lab.

## 2018-11-20 NOTE — Progress Notes (Signed)
MD Jannifer Franklin notified of Pt having small amount of Bright red BM with mucus consistency. Pt states abdomen is tender to palpation. VS stable. New orders placed. Will continue to monitor.

## 2018-11-20 NOTE — ED Triage Notes (Signed)
Patient states that she vomited twice today and states that she passed out.  Patient reports some stomach pain. Patient also reports diarrhea that started about 79min.  Patient denies fevers.  Patient denies any cold symptoms.

## 2018-11-20 NOTE — ED Provider Notes (Signed)
[facility] Emergency Department Provider Note  ____________________________________________   First MD Initiated Contact with Patient 11/20/18 1655     (approximate)  I have reviewed the triage vital signs and the nursing notes.   HISTORY  Chief Complaint Emesis and Nausea    HPI Laurie Pennington is a 68 y.o. female  With h/o HTN here with n/v/d. Pt states she was in her usual state of health this morning. She went to a [employer] related cookout and ate burger and hot dog w/ slaw. She does not know who prepared it. She states that 1-2 hours later, she developed acute onset of crampy, severe abd pain with nausea. She ahd 2 episodes of what she describes as projectile, NBNB emesis. She then has since had ongoing nausea and 3 episodes of severe diarrhea. No one else is sick that was at the cookout as far as she is aware. She has a crampy lower abd pain with it. No urinary sx. No blood in emesis or diarrhea. Of note, however, she also just accidentally took a high dose of her lisinopril and increased her diuretic. This was accidental. She took 10 mg of lisinopril instead of 5, though she had ben on 2.5 and this was just increased yesterday.        Past Medical History:  Diagnosis Date   Hypertension     Patient Active Problem List   Diagnosis Date Noted   ARF (acute renal failure) (HCC) 11/20/2018    Past Surgical History:  Procedure Laterality Date   ABDOMINAL HYSTERECTOMY     BREAST EXCISIONAL BIOPSY Left 1991   neg   COLONOSCOPY N/A 10/13/2014   Procedure: COLONOSCOPY;  Surgeon: Christena DeemMartin U Skulskie, MD;  [facility];  Service: Endoscopy;  Laterality: N/A;    Prior to Admission medications   Medication Sig Start Date End Date Taking? Authorizing Provider  chlorthalidone (HYGROTON) 25 MG tablet Take 25 mg by mouth daily.  10/04/18  Yes [provider]  Cholecalciferol (VITAMIN D) 50 MCG (2000 UT) tablet Take 2,000 Units by  mouth daily.   Yes [provider]  escitalopram (LEXAPRO) 20 MG tablet Take 20 mg by mouth daily.    Yes [provider]  Glucosamine-Chondroit-Vit C-Mn (GLUCOSAMINE CHONDR 1500 COMPLX) CAPS Take 1 capsule by mouth as directed.   Yes [provider]  levothyroxine (SYNTHROID) 25 MCG tablet Take 25 mcg by mouth daily.    Yes [provider]  lisinopril (ZESTRIL) 5 MG tablet Take 5 mg by mouth daily.  11/01/18  Yes [provider]  LORazepam (ATIVAN) 0.5 MG tablet Take 0.5 mg by mouth 2 (two) times daily as needed for anxiety.    Yes [provider]  meloxicam (MOBIC) 15 MG tablet Take 15 mg by mouth daily.   Yes [provider]  montelukast (SINGULAIR) 10 MG tablet Take 10 mg by mouth at bedtime.    Yes [provider]  Multiple Vitamin (MULTI-VITAMIN) tablet Take 1 tablet by mouth daily.    Yes [provider]  traMADol (ULTRAM) 50 MG tablet Take 50 mg by mouth every 6 (six) hours as needed for moderate pain.  11/16/18 12/16/19 Yes [provider]  rosuvastatin (CRESTOR) 5 MG tablet Take 5 mg by mouth at bedtime.  11/19/18   [provider]  hydrochlorothiazide (HYDRODIURIL) 25 MG tablet Take 25 mg by mouth daily.  11/20/18  [provider]  omeprazole (PRILOSEC) 40 MG capsule Take 40 mg by mouth  daily.  11/20/18  [provider]  propranolol ER (INDERAL LA) 120 MG 24 hr capsule Take 120 mg by mouth daily.  11/20/18  [provider]    Allergies Other, Atorvastatin, Metronidazole, and Percocet [oxycodone-acetaminophen]  Family History  Problem Relation Age of Onset   Breast cancer Paternal Aunt    Breast cancer Paternal Aunt    Breast cancer Paternal Aunt    Breast cancer Paternal Aunt     Social History Social History   Tobacco Use   Smoking status: Never Smoker   Smokeless tobacco: Never Used  Substance Use Topics   Alcohol use: Yes    Alcohol/week: 1.0  standard drinks    Types: 1 Glasses of wine per week   Drug use: Never    Review of Systems  Review of Systems  Constitutional: Positive for fatigue. Negative for fever.  HENT: Negative for congestion and sore throat.   Eyes: Negative for visual disturbance.  Respiratory: Negative for cough.   Gastrointestinal: Positive for abdominal pain, diarrhea, nausea and vomiting.  Genitourinary: Negative for flank pain.  Musculoskeletal: Negative for back pain and neck pain.  Skin: Negative for rash and wound.  Neurological: Positive for weakness.  All other systems reviewed and are negative.    ____________________________________________  PHYSICAL EXAM:      VITAL SIGNS: ED Triage Vitals  Enc Vitals Group     BP 11/20/18 1710 (!) 90/55     Pulse Rate 11/20/18 1710 73     Resp 11/20/18 1710 (!) 73     Temp 11/20/18 1710 97.9 F (36.6 C)     Temp Source 11/20/18 1710 Oral     SpO2 11/20/18 1710 98 %     Weight 11/20/18 1711 235 lb (106.6 kg)     Height 11/20/18 1711 5' 3.5" (1.613 m)     Head Circumference --      Peak Flow --      Pain Score 11/20/18 1711 3     Pain Loc --      Pain Edu? --      Excl. in [place]? --      Physical Exam Vitals signs and nursing note reviewed.  Constitutional:      General: She is not in acute distress.    Appearance: She is well-developed.  HENT:     Head: Normocephalic and atraumatic.     Mouth/Throat:     Mouth: Mucous membranes are dry.  Eyes:     Conjunctiva/sclera: Conjunctivae normal.  Neck:     Musculoskeletal: Neck supple.  Cardiovascular:     Rate and Rhythm: Normal rate and regular rhythm.     Heart sounds: Normal heart sounds. No murmur. No friction rub.  Pulmonary:     Effort: No respiratory distress.     Breath sounds: Normal breath sounds. No wheezing or rales.  Abdominal:     General: Abdomen is flat. Bowel sounds are increased. There is no distension.     Palpations: Abdomen is soft.     Tenderness: There is  generalized abdominal tenderness and tenderness in the suprapubic area. There is no guarding or rebound.  Skin:    General: Skin is warm.     Capillary Refill: Capillary refill takes less than 2 seconds.  Neurological:     Mental Status: She is alert and oriented to person, place, and time.     Motor: No abnormal muscle tone.       ____________________________________________   LABS (all labs  ordered are listed, but only abnormal results are displayed)  Labs Reviewed  LIPASE, BLOOD - Abnormal; Notable for the following components:      Result Value   Lipase 52 (*)    All other components within normal limits  COMPREHENSIVE METABOLIC PANEL - Abnormal; Notable for the following components:   Glucose, Bld 136 (*)    BUN 45 (*)    Creatinine, Ser 2.25 (*)    GFR calc non Af Amer 22 (*)    GFR calc Af Amer 25 (*)    All other components within normal limits  URINALYSIS, COMPLETE (UACMP) WITH MICROSCOPIC - Abnormal; Notable for the following components:   Color, Urine YELLOW (*)    APPearance HAZY (*)    Hgb urine dipstick LARGE (*)    Protein, ur 30 (*)    Bacteria, UA RARE (*)    All other components within normal limits  CBC WITH DIFFERENTIAL/PLATELET - Abnormal; Notable for the following components:   WBC 13.6 (*)    Hemoglobin 11.5 (*)    HCT 34.7 (*)    Neutro Abs 10.4 (*)    All other components within normal limits  GASTROINTESTINAL PANEL BY PCR, STOOL (REPLACES STOOL CULTURE)  C DIFFICILE QUICK SCREEN W PCR REFLEX  SARS CORONAVIRUS 2  LACTIC ACID, PLASMA  HIV ANTIBODY (ROUTINE TESTING W REFLEX)  BASIC METABOLIC PANEL  CBC    ____________________________________________  EKG: Normal sinus rhythm, VR 74. No ischemic changes. ________________________________________  RADIOLOGY All imaging, including plain films, CT scans, and ultrasounds, independently reviewed by me, and interpretations confirmed via formal radiology reads.  ED MD interpretation:   CT :  Enterocolitis, possible focal colitis, no surgical abnormality  Official radiology report(s): Ct Abdomen Pelvis Wo Contrast  Result Date: 11/20/2018 CLINICAL DATA:  Lower abdominal cramping, nausea vomiting and diarrhea. EXAM: CT ABDOMEN AND PELVIS WITHOUT CONTRAST TECHNIQUE: Multidetector CT imaging of the abdomen and pelvis was performed following the standard protocol without IV contrast. COMPARISON:  Aug 15, 2009 FINDINGS: Lower chest: Calcific atherosclerotic disease of the coronary arteries. Normal heart size. Small pericardial effusion. Small hiatal hernia. Hepatobiliary: No focal liver abnormality is seen. 2.1 cm gallstone in the neck of the gallbladder. No gallbladder wall thickening, or biliary dilatation. Pancreas: Unremarkable. No pancreatic ductal dilatation or surrounding inflammatory changes. Spleen: Normal in size without focal abnormality. Adrenals/Urinary Tract: Adrenal glands are unremarkable. Kidneys are normal, without renal calculi, focal lesion, or hydronephrosis. Bladder is unremarkable. Stomach/Bowel: Stomach is within normal limits. Appendix appears normal. No evidence of bowel wall thickening, distention, or inflammatory changes. Vascular/Lymphatic: Aortic atherosclerosis. No enlarged abdominal or pelvic lymph nodes. Reproductive: Status post hysterectomy. No adnexal masses. Other: Central mesentery fat stranding with scattered sub pathologic by CT criteria lymph nodes within the area of mesenteric stranding, images 39-53/96, sequence 2. Musculoskeletal: Spondylosis of the lumbosacral spine with posterior facet arthropathy in the lower lumbosacral spine. IMPRESSION: 1. Cholelithiasis without CT evidence of acute cholecystitis. 2. Central mesentery fat stranding with scattered sub pathologic by CT criteria lymph nodes within the area of mesenteric stranding. This is a nonspecific finding and may be reactive changes due to transient enterocolitis. Mesenteric panniculitis is a secondary  consideration. 3. Questionable asymmetric thickening of the sigmoid colon. This may represent colitis, however the abnormality is somewhat focal and therefore correlation with colonoscopy to exclude underlying colonic mass is recommended. 4. Small hiatal hernia. 5. Calcific atherosclerotic disease of the coronary arteries and aorta. Electronically Signed   By: Ted Mcalpineobrinka  Dimitrova  M.D.   On: 11/20/2018 18:49    ____________________________________________  PROCEDURES   Procedure(s) performed (including Critical Care):  Procedures  ____________________________________________  INITIAL IMPRESSION / MDM / ASSESSMENT AND PLAN / ED COURSE  As part of my medical decision making, I reviewed the following data within the electronic MEDICAL RECORD NUMBER Notes from prior ED visits and [place] Controlled Substance Database      *Corra Kaine was evaluated in Emergency Department on 11/20/2018 for the symptoms described in the history of present illness. She was evaluated in the context of the global COVID-19 pandemic, which necessitated consideration that the patient might be at risk for infection with the SARS-CoV-2 virus that causes COVID-19. Institutional protocols and algorithms that pertain to the evaluation of patients at risk for COVID-19 are in a state of rapid change based on information released by regulatory bodies including the CDC and federal and state organizations. These policies and algorithms were followed during the patient's care in the ED.  Some ED evaluations and interventions may be delayed as a result of limited staffing during the pandemic.*   Clinical Course as of Nov 20 2255  Sat Nov 20, 2018  1851 68 yo F here with n/v and abd pain. Suspect this is 2/2 food-borne illness complicated by relative hypotension from accidental increase in lisinopril dosing, with possible submucosal ischemic colitis as she has h/o same. LA normal, doubt embolic or acute severe ischemia. CT pending. Labs show  marked AKI which is likely 2/2 hypoperfusion.    [CI]  1853 CT scan shows entercolitis. Question of possible underlying colonic mass as well. Will continue IVF, plan to monitor in hospital gven AKI. BP improving with fluids.   [CI]    Clinical Course User Index [CI] Shaune Pollack, MD    Medical Decision Making: As above. Admit.  ____________________________________________  FINAL CLINICAL IMPRESSION(S) / ED DIAGNOSES  Final diagnoses:  Enterocolitis  Dehydration  AKI (acute kidney injury) (HCC)     MEDICATIONS GIVEN DURING THIS VISIT:  Medications  0.9 %  sodium chloride infusion (has no administration in time range)  traMADol (ULTRAM) tablet 50 mg (has no administration in time range)  rosuvastatin (CRESTOR) tablet 5 mg (5 mg Oral Not Given 11/20/18 2225)  escitalopram (LEXAPRO) tablet 20 mg (has no administration in time range)  LORazepam (ATIVAN) tablet 0.5 mg (has no administration in time range)  levothyroxine (SYNTHROID) tablet 25 mcg (has no administration in time range)  cholecalciferol (VITAMIN D3) tablet 2,000 Units (has no administration in time range)  multivitamin with minerals tablet 1 tablet (has no administration in time range)  montelukast (SINGULAIR) tablet 10 mg (10 mg Oral Not Given 11/20/18 2226)  enoxaparin (LOVENOX) injection 30 mg (has no administration in time range)  0.9 %  sodium chloride infusion (has no administration in time range)  acetaminophen (TYLENOL) tablet 650 mg (has no administration in time range)    Or  acetaminophen (TYLENOL) suppository 650 mg (has no administration in time range)  ondansetron (ZOFRAN) tablet 4 mg (has no administration in time range)    Or  ondansetron (ZOFRAN) injection 4 mg (has no administration in time range)  dicyclomine (BENTYL) capsule 10 mg (10 mg Oral Given 11/20/18 2206)  sodium chloride 0.9 % bolus 1,000 mL (0 mLs Intravenous Stopped 11/20/18 1858)  sodium chloride 0.9 % bolus 1,000 mL (0 mLs Intravenous  Stopped 11/20/18 2206)     ED Discharge Orders    None       Note:  This  document was prepared using Conservation officer, historic buildingsDragon voice recognition software and may include unintentional dictation errors.   Shaune PollackIsaacs, Shaunie Boehm, MD 11/20/18 2257

## 2018-11-20 NOTE — ED Notes (Signed)
ED TO INPATIENT HANDOFF REPORT  ED Nurse Name and Phone #: Victorino DikeJennifer [phone]  S Name/Age/Gender Laurie Pennington 68 y.o. female Room/Bed: ED15A/ED15A  Code Status   Code Status: Not on file  Home/SNF/Other Home Patient oriented to: self, place, time and situation Is this baseline? Yes   Triage Complete: Triage complete  Chief Complaint nausea  Triage Note PT [facility] ER [facility] with reports of n/v that started approx 1 1/ hours after eating at a function.  Pt also reports [facility] that [facility].  Pt states lower abdominal cramping.  States that she feels better now, but feels weak.  Pt states that she took an extra BP med today.   Allergies Allergies  Allergen Reactions  . Other Nausea And Vomiting  . Atorvastatin Other (See Comments)    Stopped 11/2017 and myalgias resolved  . Metronidazole Nausea Only  . Percocet [Oxycodone-Acetaminophen] Nausea And Vomiting    Level of Care/Admitting Diagnosis ED Disposition    ED Disposition Condition Comment   Admit  [facility]ospital [facility] [[id number]]  Level of Care: Med-Surg [16]  Covid Evaluation: Asymptomatic Screening Protocol (No Symptoms)  Diagnosis: ARF (acute renal failure) [facility](HCC) [[id number]][[id number]]  Admitting Physician: Enid BaasKALISETTI, RADHIKA [[id number]]  Attending Physician: Enid BaasKALISETTI, RADHIKA [[id number]]  Estimated length of stay: past midnight tomorrow  Certification:: I certify this patient will need inpatient services for at least 2 midnights  PT Class (Do Not Modify): Inpatient [101]  PT Acc Code (Do Not Modify): Private [1]       B Medical/Surgery History Past Medical History:  Diagnosis Date  . Hypertension    Past Surgical History:  Procedure Laterality Date  . ABDOMINAL HYSTERECTOMY    . BREAST EXCISIONAL BIOPSY Left 1991   neg  . COLONOSCOPY N/A 10/13/2014   Procedure: COLONOSCOPY;  Surgeon: Christena DeemMartin U Skulskie, MD;  [facility] ENDOSCOPY;  Service: Endoscopy;   Laterality: N/A;     A IV Location/Drains/Wounds Patient Lines/Drains/Airways Status   Active Line/Drains/Airways    Name:   Placement date:   Placement time:   Site:   Days:   Peripheral IV 11/20/18 Right Antecubital   11/20/18    1730    Antecubital   less than 1   Airway   10/13/14    1503     1499          Intake/Output Last 24 hours No intake or output data in the 24 hours ending 11/20/18 2149  Labs/Imaging Results for orders placed or performed during the hospital encounter of 11/20/18 (from the past 48 hour(s))  Lipase, blood     Status: Abnormal   Collection Time: 11/20/18  5:29 PM  Result Value Ref Range   Lipase 52 (H) 11 - 51 U/L    Comment: Performed at [facility], [address] [place], [place] [address]  Comprehensive metabolic panel     Status: Abnormal   Collection Time: 11/20/18  5:29 PM  Result Value Ref Range   Sodium 139 135 - 145 mmol/L   Potassium 3.8 3.5 - 5.1 mmol/L   Chloride 99 98 - 111 mmol/L   CO2 25 22 - 32 mmol/L   Glucose, Bld 136 (H) 70 - 99 mg/dL   BUN 45 (H) 8 - 23 mg/dL   Creatinine, Ser 5.782.25 (H) 0.44 - 1.00 mg/dL   Calcium 46.910.1 8.9 - 62.910.3 mg/dL   Total Protein 7.4 6.5 - 8.1 g/dL   Albumin 4.4 3.5 -  5.0 g/dL   AST 22 15 - 41 U/L   ALT 16 0 - 44 U/L   Alkaline Phosphatase 75 38 - 126 U/L   Total Bilirubin 0.6 0.3 - 1.2 mg/dL   GFR calc non Af Amer 22 (L) >60 mL/min   GFR calc Af Amer 25 (L) >60 mL/min   Anion gap 15 5 - 15    Comment: Performed at [facility], [address] [place], [place] [address]  CBC with Differential     Status: Abnormal   Collection Time: 11/20/18  5:29 PM  Result Value Ref Range   WBC 13.6 (H) 4.0 - 10.5 K/uL   RBC 3.88 3.87 - 5.11 MIL/uL   Hemoglobin 11.5 (L) 12.0 - 15.0 g/dL   HCT 95.634.7 (L) 21.336.0 - 08.646.0 %   MCV 89.4 80.0 - 100.0 fL   MCH 29.6 26.0 - 34.0 pg   MCHC 33.1 30.0 - 36.0 g/dL   RDW 57.813.1 46.911.5 - 62.915.5 %   Platelets 257 150 - 400 K/uL   nRBC 0.0 0.0 - 0.2 %    Neutrophils Relative % 78 %   Neutro Abs 10.4 (H) 1.7 - 7.7 K/uL   Lymphocytes Relative 14 %   Lymphs Abs 1.9 0.7 - 4.0 K/uL   Monocytes Relative 7 %   Monocytes Absolute 1.0 0.1 - 1.0 K/uL   Eosinophils Relative 1 %   Eosinophils Absolute 0.1 0.0 - 0.5 K/uL   Basophils Relative 0 %   Basophils Absolute 0.1 0.0 - 0.1 K/uL   Immature Granulocytes 0 %   Abs Immature Granulocytes 0.06 0.00 - 0.07 K/uL    Comment: Performed at [facility], [address] [place], [place] [address]  Lactic acid, plasma     Status: None   Collection Time: 11/20/18  5:29 PM  Result Value Ref Range   Lactic Acid, Venous 1.6 0.5 - 1.9 mmol/L    Comment: Performed at [facility], [address] [place], [place] [address]  Urinalysis, Complete w Microscopic     Status: Abnormal   Collection Time: 11/20/18  7:13 PM  Result Value Ref Range   Color, Urine YELLOW (A) YELLOW   APPearance HAZY (A) CLEAR   Specific Gravity, Urine 1.017 1.005 - 1.030   pH 6.0 5.0 - 8.0   Glucose, UA NEGATIVE NEGATIVE mg/dL   Hgb urine dipstick LARGE (A) NEGATIVE   Bilirubin Urine NEGATIVE NEGATIVE   Ketones, ur NEGATIVE NEGATIVE mg/dL   Protein, ur 30 (A) NEGATIVE mg/dL   Nitrite NEGATIVE NEGATIVE   Leukocytes,Ua NEGATIVE NEGATIVE   RBC / HPF 11-20 0 - 5 RBC/hpf   WBC, UA 0-5 0 - 5 WBC/hpf   Bacteria, UA RARE (A) NONE SEEN   Squamous Epithelial / LPF 0-5 0 - 5   Mucus PRESENT    Hyaline Casts, UA PRESENT    Ca Oxalate Crys, UA PRESENT     Comment: Performed at [facility], [address] [place], [place] [address]  Gastrointestinal Panel by PCR , Stool     Status: None   Collection Time: 11/20/18  7:13 PM   Specimen: Stool  Result Value Ref Range   Campylobacter species NOT DETECTED NOT DETECTED   Plesimonas shigelloides NOT DETECTED NOT DETECTED   Salmonella species NOT DETECTED NOT DETECTED   Yersinia enterocolitica NOT DETECTED NOT DETECTED   Vibrio species NOT DETECTED NOT DETECTED    Vibrio cholerae NOT DETECTED NOT DETECTED   Enteroaggregative  E coli (EAEC) NOT DETECTED NOT DETECTED   Enteropathogenic E coli (EPEC) NOT DETECTED NOT DETECTED   Enterotoxigenic E coli (ETEC) NOT DETECTED NOT DETECTED   Shiga like toxin producing E coli (STEC) NOT DETECTED NOT DETECTED   Shigella/Enteroinvasive E coli (EIEC) NOT DETECTED NOT DETECTED   Cryptosporidium NOT DETECTED NOT DETECTED   Cyclospora cayetanensis NOT DETECTED NOT DETECTED   Entamoeba histolytica NOT DETECTED NOT DETECTED   Giardia lamblia NOT DETECTED NOT DETECTED   Adenovirus F40/41 NOT DETECTED NOT DETECTED   Astrovirus NOT DETECTED NOT DETECTED   Norovirus GI/GII NOT DETECTED NOT DETECTED   Rotavirus A NOT DETECTED NOT DETECTED   Sapovirus (I, II, IV, and V) NOT DETECTED NOT DETECTED    Comment: Performed at [facility], [address] [place], [place] [address]  C difficile quick scan w PCR reflex     Status: None   Collection Time: 11/20/18  7:13 PM   Specimen: Stool  Result Value Ref Range   C Diff antigen NEGATIVE NEGATIVE   C Diff toxin NEGATIVE NEGATIVE   C Diff interpretation No C. difficile detected.     Comment: Performed at [facility], [address] [place], [place] [address]   Ct Abdomen Pelvis Wo Contrast  Result Date: 11/20/2018 CLINICAL DATA:  Lower abdominal cramping, nausea vomiting and diarrhea. EXAM: CT ABDOMEN AND PELVIS WITHOUT CONTRAST TECHNIQUE: Multidetector CT imaging of the abdomen and pelvis was performed following the standard protocol without IV contrast. COMPARISON:  Aug 15, 2009 FINDINGS: Lower chest: Calcific atherosclerotic disease of the coronary arteries. Normal heart size. Small pericardial effusion. Small hiatal hernia. Hepatobiliary: No focal liver abnormality is seen. 2.1 cm gallstone in the neck of the gallbladder. No gallbladder wall thickening, or biliary dilatation. Pancreas: Unremarkable. No pancreatic ductal dilatation or surrounding  inflammatory changes. Spleen: Normal in size without focal abnormality. Adrenals/Urinary Tract: Adrenal glands are unremarkable. Kidneys are normal, without renal calculi, focal lesion, or hydronephrosis. Bladder is unremarkable. Stomach/Bowel: Stomach is within normal limits. Appendix appears normal. No evidence of bowel wall thickening, distention, or inflammatory changes. Vascular/Lymphatic: Aortic atherosclerosis. No enlarged abdominal or pelvic lymph nodes. Reproductive: Status post hysterectomy. No adnexal masses. Other: Central mesentery fat stranding with scattered sub pathologic by CT criteria lymph nodes within the area of mesenteric stranding, images 39-53/96, sequence 2. Musculoskeletal: Spondylosis of the lumbosacral spine with posterior facet arthropathy in the lower lumbosacral spine. IMPRESSION: 1. Cholelithiasis without CT evidence of acute cholecystitis. 2. Central mesentery fat stranding with scattered sub pathologic by CT criteria lymph nodes within the area of mesenteric stranding. This is a nonspecific finding and may be reactive changes due to transient enterocolitis. Mesenteric panniculitis is a secondary consideration. 3. Questionable asymmetric thickening of the sigmoid colon. This may represent colitis, however the abnormality is somewhat focal and therefore correlation with colonoscopy to exclude underlying colonic mass is recommended. 4. Small hiatal hernia. 5. Calcific atherosclerotic disease of the coronary arteries and aorta. Electronically Signed   By: Ted Mcalpineobrinka  Dimitrova M.D.   On: 11/20/2018 18:49    Pending Labs Unresulted Labs (From admission, onward)    Start     Ordered   11/20/18 1940  SARS CORONAVIRUS 2 Nasal Swab Aptima Multi Swab  (Asymptomatic/Tier 2 Patients Labs)  Once,   STAT    Question Answer Comment  Is this test for diagnosis or screening Screening   Symptomatic for COVID-19 as defined by CDC No   Hospitalized for COVID-19 No   Admitted to  ICU for  COVID-19 No   Previously tested for COVID-19 No   Resident in a congregate (group) care setting No   Employed in healthcare setting No   Pregnant No      11/20/18 1939   Signed and Held  HIV antibody (Routine Testing)  Once,   R     Signed and Held   Signed and Held  Basic metabolic panel  Tomorrow morning,   R     Signed and Held   Signed and Held  CBC  Tomorrow morning,   R     Signed and Held          Vitals/Pain Today's Vitals   11/20/18 1800 11/20/18 1900 11/20/18 2030 11/20/18 2100  BP: 105/63 121/62 122/64 (!) 109/58  Pulse: 72 70 72 71  Resp: 12 17 11 13   Temp:      TempSrc:      SpO2: 100% 100% 98% 100%  Weight:      Height:      PainSc:        Isolation Precautions Enteric precautions (UV disinfection)  Medications Medications  0.9 %  sodium chloride infusion (has no administration in time range)  dicyclomine (BENTYL) capsule 10 mg (has no administration in time range)  sodium chloride 0.9 % bolus 1,000 mL (0 mLs Intravenous Stopped 11/20/18 1858)  sodium chloride 0.9 % bolus 1,000 mL (1,000 mLs Intravenous New Bag/Given 11/20/18 1935)    Mobility walks Low fall risk   Focused Assessments Cardiac Assessment Handoff:  Cardiac Rhythm: Normal sinus rhythm Lab Results  Component Value Date   TROPONINI < 0.02 11/05/2013   No results found for: DDIMER Does the Patient currently have chest pain? No     R Recommendations: See Admitting Provider Note  Report given to:   Additional Notes:

## 2018-11-20 NOTE — ED Triage Notes (Signed)
PT to ER via EMS from [facility] with reports of n/v that started approx 1 1/ hours after eating at a function.  Pt also reports diarrhea that started after arriving at [facility].  Pt states lower abdominal cramping.  States that she feels better now, but feels weak.  Pt states that she took an extra BP med today.

## 2018-11-20 NOTE — H&P (Signed)
[facility] [place] [facility]   PATIENT NAME: Laurie Pennington    MR#:  [id number]  DATE OF BIRTH:  Jan 04, 1951  DATE OF ADMISSION:  11/20/2018  PRIMARY CARE PHYSICIAN: Verner MouldFowler, Vickie A, MD   REQUESTING/REFERRING PHYSICIAN: Dr. Leta Junglingameron Isaac  CHIEF COMPLAINT:   Chief Complaint  Patient presents with   Emesis   Nausea    HISTORY OF PRESENT ILLNESS:  Laurie Pennington  is a 68 y.o. female with a known history of hypertension who is independent at baseline comes from home secondary to abdominal pain, nausea vomiting and diarrhea that started this afternoon along with a presyncopal episode. Patient was in her normal state of health up until yesterday.  She went to her routine cardiology appointment.  She was advised to increase her lisinopril from 2.5 to 5 mg.  She accidentally took 10 mg of lisinopril this morning.  Did not feel lightheaded up until this afternoon.  She went to a cookout had some hot dogs, coleslaw and within 2 hours she started having abdominal cramping pain associated with projectile vomiting x3 and loose stools almost x4 since then.  Following these episodes she had a near syncopal episode and so brought to the emergency room. Other than increasing the lisinopril today, denies taking any other new medications.  She is on meloxicam at home.  Her baseline creatinine was at 1, currently creatinine is 2.2.  She was hypotensive improved with IV fluids here in the emergency room.  Continues to have diarrhea but no further vomiting.  PAST MEDICAL HISTORY:   Past Medical History:  Diagnosis Date   Hypertension     PAST SURGICAL HISTORY:   Past Surgical History:  Procedure Laterality Date   ABDOMINAL HYSTERECTOMY     BREAST EXCISIONAL BIOPSY Left 1991   neg   COLONOSCOPY N/A 10/13/2014   Procedure: COLONOSCOPY;  Surgeon: Christena DeemMartin U Skulskie, MD;  [facility]COPY;  Service: Endoscopy;  Laterality: N/A;    SOCIAL HISTORY:   Social History    Tobacco Use   Smoking status: Never Smoker   Smokeless tobacco: Never Used  Substance Use Topics   Alcohol use: Yes    FAMILY HISTORY:   Family History  Problem Relation Age of Onset   Breast cancer Paternal Aunt    Breast cancer Paternal Aunt    Breast cancer Paternal Aunt    Breast cancer Paternal Aunt     DRUG ALLERGIES:   Allergies  Allergen Reactions   Other Nausea And Vomiting   Atorvastatin Other (See Comments)    Stopped 11/2017 and myalgias resolved   Metronidazole Nausea Only   Percocet [Oxycodone-Acetaminophen] Nausea And Vomiting    REVIEW OF SYSTEMS:   Review of Systems  Constitutional: Positive for malaise/fatigue. Negative for chills, fever and weight loss.  HENT: Negative for ear discharge, ear pain, hearing loss, nosebleeds and tinnitus.   Eyes: Negative for blurred vision, double vision and photophobia.  Respiratory: Negative for cough, hemoptysis, shortness of breath and wheezing.   Cardiovascular: Negative for chest pain, palpitations, orthopnea and leg swelling.  Gastrointestinal: Positive for abdominal pain, diarrhea, nausea and vomiting. Negative for constipation, heartburn and melena.  Genitourinary: Negative for dysuria, frequency, hematuria and urgency.  Musculoskeletal: Negative for back pain, myalgias and neck pain.  Skin: Negative for rash.  Neurological: Positive for dizziness. Negative for tingling, tremors, sensory change, speech change, focal weakness and headaches.  Endo/Heme/Allergies: Does not bruise/bleed easily.  Psychiatric/Behavioral: Negative for depression.    MEDICATIONS  AT HOME:   Prior to Admission medications   Medication Sig Start Date End Date Taking? Authorizing Provider  chlorthalidone (HYGROTON) 25 MG tablet Take 25 mg by mouth daily.  10/04/18  Yes [provider]  Cholecalciferol (VITAMIN D) 50 MCG (2000 UT) tablet Take 2,000 Units by mouth daily.   Yes [provider]  escitalopram  (LEXAPRO) 20 MG tablet Take 20 mg by mouth daily.    Yes [provider]  Glucosamine-Chondroit-Vit C-Mn (GLUCOSAMINE CHONDR 1500 COMPLX) CAPS Take 1 capsule by mouth as directed.   Yes [provider]  levothyroxine (SYNTHROID) 25 MCG tablet Take 25 mcg by mouth daily.    Yes [provider]  lisinopril (ZESTRIL) 5 MG tablet Take 5 mg by mouth daily.  11/01/18  Yes [provider]  LORazepam (ATIVAN) 0.5 MG tablet Take 0.5 mg by mouth 2 (two) times daily as needed for anxiety.    Yes [provider]  meloxicam (MOBIC) 15 MG tablet Take 15 mg by mouth daily.   Yes [provider]  montelukast (SINGULAIR) 10 MG tablet Take 10 mg by mouth at bedtime.    Yes [provider]  Multiple Vitamin (MULTI-VITAMIN) tablet Take 1 tablet by mouth daily.    Yes [provider]  traMADol (ULTRAM) 50 MG tablet Take 50 mg by mouth every 6 (six) hours as needed for moderate pain.  11/16/18 12/16/19 Yes [provider]  rosuvastatin (CRESTOR) 5 MG tablet Take 5 mg by mouth at bedtime.  11/19/18   [provider]  hydrochlorothiazide (HYDRODIURIL) 25 MG tablet Take 25 mg by mouth daily.  11/20/18  [provider]  omeprazole (PRILOSEC) 40 MG capsule Take 40 mg by mouth daily.  11/20/18  [provider]  propranolol ER (INDERAL LA) 120 MG 24 hr capsule Take 120 mg by mouth daily.  11/20/18  [provider]      VITAL SIGNS:  Blood pressure 121/62, pulse 70, temperature 97.9 F (36.6 C), temperature source Oral, resp. rate 17, height 5' 3.5" (1.613 m), weight 106.6 kg, SpO2 100 %.  PHYSICAL EXAMINATION:  Physical Exam  GENERAL:  68 y.o.-year-old patient lying in the bed with no acute distress.  EYES: Pupils equal, round, reactive to light and accommodation. No scleral icterus. Extraocular muscles intact.  HEENT: Head atraumatic, normocephalic. Oropharynx and nasopharynx clear.  NECK:  Supple, no jugular venous  distention. No thyroid enlargement, no tenderness.  LUNGS: Normal breath sounds bilaterally, no wheezing, rales,rhonchi or crepitation. No use of accessory muscles of respiration.  CARDIOVASCULAR: S1, S2 normal. No murmurs, rubs, or gallops.  ABDOMEN: Soft, tender in left lower abdomen without guarding or rigidity,, nondistended. Bowel sounds present. No organomegaly or mass.  EXTREMITIES: No pedal edema, cyanosis, or clubbing.  NEUROLOGIC: Cranial nerves II through XII are intact. Muscle strength 5/5 in all extremities. Sensation intact. Gait not checked.  PSYCHIATRIC: The patient is alert and oriented x 3.  SKIN: No obvious rash, lesion, or ulcer.   LABORATORY PANEL:   CBC Recent Labs  Lab 11/20/18 1729  WBC 13.6*  HGB 11.5*  HCT 34.7*  PLT 257   ------------------------------------------------------------------------------------------------------------------  Chemistries  Recent Labs  Lab 11/20/18 1729  NA 139  K 3.8  CL 99  CO2 25  GLUCOSE 136*  BUN 45*  CREATININE 2.25*  CALCIUM 10.1  AST 22  ALT 16  ALKPHOS 75  BILITOT 0.6   ------------------------------------------------------------------------------------------------------------------  Cardiac Enzymes No results for input(s): TROPONINI in the last  168 hours. ------------------------------------------------------------------------------------------------------------------  RADIOLOGY:  Ct Abdomen Pelvis Wo Contrast  Result Date: 11/20/2018 CLINICAL DATA:  Lower abdominal cramping, nausea vomiting and diarrhea. EXAM: CT ABDOMEN AND PELVIS WITHOUT CONTRAST TECHNIQUE: Multidetector CT imaging of the abdomen and pelvis was performed following the standard protocol without IV contrast. COMPARISON:  Aug 15, 2009 FINDINGS: Lower chest: Calcific atherosclerotic disease of the coronary arteries. Normal heart size. Small pericardial effusion. Small hiatal hernia. Hepatobiliary: No focal liver abnormality is seen. 2.1 cm  gallstone in the neck of the gallbladder. No gallbladder wall thickening, or biliary dilatation. Pancreas: Unremarkable. No pancreatic ductal dilatation or surrounding inflammatory changes. Spleen: Normal in size without focal abnormality. Adrenals/Urinary Tract: Adrenal glands are unremarkable. Kidneys are normal, without renal calculi, focal lesion, or hydronephrosis. Bladder is unremarkable. Stomach/Bowel: Stomach is within normal limits. Appendix appears normal. No evidence of bowel wall thickening, distention, or inflammatory changes. Vascular/Lymphatic: Aortic atherosclerosis. No enlarged abdominal or pelvic lymph nodes. Reproductive: Status post hysterectomy. No adnexal masses. Other: Central mesentery fat stranding with scattered sub pathologic by CT criteria lymph nodes within the area of mesenteric stranding, images 39-53/96, sequence 2. Musculoskeletal: Spondylosis of the lumbosacral spine with posterior facet arthropathy in the lower lumbosacral spine. IMPRESSION: 1. Cholelithiasis without CT evidence of acute cholecystitis. 2. Central mesentery fat stranding with scattered sub pathologic by CT criteria lymph nodes within the area of mesenteric stranding. This is a nonspecific finding and may be reactive changes due to transient enterocolitis. Mesenteric panniculitis is a secondary consideration. 3. Questionable asymmetric thickening of the sigmoid colon. This may represent colitis, however the abnormality is somewhat focal and therefore correlation with colonoscopy to exclude underlying colonic mass is recommended. 4. Small hiatal hernia. 5. Calcific atherosclerotic disease of the coronary arteries and aorta. Electronically Signed   By: Ted Mcalpineobrinka  Dimitrova M.D.   On: 11/20/2018 18:49      IMPRESSION AND PLAN:   Laurie Pennington  is a [age] y.o. female with a known history of hypertension who is independent at baseline comes from home secondary to abdominal pain, nausea vomiting and diarrhea that  started this afternoon along with a presyncopal episode.  1.  Near syncopal episode-secondary to hypotension and vasovagal after severe vomiting's. -Alert and oriented, denies any dizziness.  IV fluids and monitor  2.  Acute renal failure-likely prerenal from hypovolemia and hypotension. -IV fluids and monitor.  No trouble voiding.  Hold meloxicam, lisinopril and chlorthalidone  3.  Acute gastroenteritis-likely food poisoning.  Since started within 1 to 2 hours after eating with projectile vomitus,  -Continue to monitor, IV hydration. -Stool has been sent for C. difficile and GI panel  4.  Hypertension-hold blood pressure medications given hypotension and near syncope on admission  5.  DVT prophylaxis-Lovenox  Patient is independent at baseline Updated her husband at bedside   All the records are reviewed and case discussed with ED provider. Management plans discussed with the patient, family and they are in agreement.  CODE STATUS: Full Code  TOTAL TIME TAKING CARE OF THIS PATIENT: 52 minutes.    Enid Baasadhika Odette Watanabe M.D on 11/20/2018 at 8:24 PM  Between 7am to 6pm - Pager - [phone]  After 6pm go to [email or web address] - Scientist, research (life sciences)password [email or web address]  [facility] [place] [facility]  Office  [phone]  CC: Primary care physician; Verner MouldFowler, Vickie A, MD   Note: This dictation was prepared with Dragon dictation along with smaller phrase technology. Any transcriptional errors that result from this process are unintentional.

## 2018-11-20 NOTE — ED Provider Notes (Signed)
[facility]    CSN: [id number] Arrival date & time: 11/20/18  1548  History   Chief Complaint Chief Complaint  Patient presents with  . Emesis   HPI  68 year old female presents with nausea, vomiting, diarrhea.  Started suddenly this afternoon after she ate at a cookout.  She believes that she ate some bad food.  She has had 3 episodes of emesis with accompanying syncope with 2 episodes.  She has had diffuse watery diarrhea as well.  She reports some lower abdominal pain.  Feels faint.  Endorses compliance with her medications at home.  She took her antihypertensive this morning.  She was brought back urgently due to her symptoms and presentation.  No fever.  No other reported symptoms.   PMH, Surgical Hx, Family Hx, Social History reviewed and updated as below.  PMH: Hypertension  [place] LVH [place]  DDD (degenerative disc disease)    HTN (hypertension) 11/08/2013   Cholelithiasis 11/08/2013   Depression 03/21/2014   Ischemic colitis (CMS-HCC) 08/07/2009   Diverticulosis 08/07/2009   Obesity    IBS (irritable bowel syndrome)    Diverticulitis 10/13/2014   Tortuous colon 10/13/2014   Internal hemorrhoids 10/13/2014   External hemorrhoids 10/13/2014   Hyperlipidemia, unspecified hyperlipidemia type 06/06/2016     Surgical Hx: breast lumpectomy 04/14/1989 - 04/13/1990 Left benign   HYSTERECTOMY 04/15/1979 - 04/13/1980  still have ovaries   CLOSED REDUCTION ORBITAL FRACTURE 04/15/1995 - 04/13/1996 Right [place]   EXPLORATORY LAPAROTOMY  Left lysis of adhesions   COLONOSCOPY 05/11/2006  05/11/2016   COLONOSCOPY 08/07/2009  08/08/2014   COLONOSCOPY 10/13/2014  Negative colon biopsies/FHx of polyps-Brother/Repeat 6191yrs/MUS     Home Medications    Prior to Admission medications   Medication Sig Start Date End Date Taking? Authorizing Provider  chlorthalidone (HYGROTON) 25 MG tablet Take by mouth. 10/04/18  Yes [provider]  dicyclomine  (BENTYL) 20 MG tablet Take 20 mg by mouth every 6 (six) hours.   Yes [provider]  escitalopram (LEXAPRO) 10 MG tablet Take 10 mg by mouth daily.   Yes [provider]  levothyroxine (SYNTHROID) 25 MCG tablet Take on an empty stomach with a glass of water at least 30-60 minutes before breakfast. 09/15/18 09/13/19 Yes [provider]  lisinopril (ZESTRIL) 5 MG tablet  11/01/18  Yes [provider]  LORazepam (ATIVAN) 0.5 MG tablet Take 0.5 mg by mouth.   Yes [provider]  meloxicam (MOBIC) 15 MG tablet Take 15 mg by mouth daily.   Yes [provider]  montelukast (SINGULAIR) 10 MG tablet Take by mouth. 07/14/18 07/14/19 Yes [provider]  Multiple Vitamin (MULTI-VITAMIN) tablet Take by mouth.   Yes [provider]  rosuvastatin (CRESTOR) 5 MG tablet  11/19/18  Yes [provider]  traMADol (ULTRAM) 50 MG tablet Take by mouth. 11/16/18 12/16/19 Yes [provider]  desonide (DESOWEN) 0.05 % cream Apply topically 2 (two) times daily.    [provider]  hydrochlorothiazide (HYDRODIURIL) 25 MG tablet Take 25 mg by mouth daily.  11/20/18  [provider]  omeprazole (PRILOSEC) 40 MG capsule Take 40 mg by mouth daily.  11/20/18  [provider]  propranolol ER (INDERAL LA) 120 MG 24 hr capsule Take 120 mg by mouth daily.  11/20/18  [provider]    Family History Cardiomyopathy (Abnormal function of the heart muscle) Brother    Coronary Artery Disease (Blocked arteries around heart) Brother  High blood pressure (Hypertension) Brother    Obesity Brother    Cardiomyopathy (Abnormal function of the heart muscle) Brother    Colon polyps Brother    Coronary Artery Disease (Blocked arteries around heart) Brother    Coronary Artery Disease (Blocked arteries around heart) Brother    Obesity Father    Prostate cancer Father    Hip fracture Mother    Gallbladder  disease Son      Social History Social History   Tobacco Use  . Smoking status: Never Smoker  . Smokeless tobacco: Never Used  Substance Use Topics  . Alcohol use: Yes  . Drug use: Not on file     Allergies   Other, Atorvastatin, Metronidazole, and Percocet [oxycodone-acetaminophen]   Review of Systems Review of Systems  Gastrointestinal: Positive for diarrhea, nausea and vomiting.  Neurological: Positive for syncope.   Physical Exam Triage Vital Signs ED Triage Vitals  Enc Vitals Group     BP 11/20/18 1610 (!) 81/70     Pulse Rate 11/20/18 1607 91     Resp 11/20/18 1607 16     Temp 11/20/18 1607 97.8 F (36.6 C)     Temp Source 11/20/18 1607 Oral     SpO2 11/20/18 1607 96 %     Weight 11/20/18 1559 230 lb (104.3 kg)     Height 11/20/18 1559 5' 3.5" (1.613 m)     Head Circumference --      Peak Flow --      Pain Score 11/20/18 1559 4     Pain Loc --      Pain Edu? --      Excl. in GC? --    Updated Vital Signs BP (!) 81/70 (BP Location: Right Arm)   Pulse 91   Temp 97.8 F (36.6 C) (Oral)   Resp 16   Ht 5' 3.5" (1.613 m)   Wt 104.3 kg   SpO2 96%   BMI 40.10 kg/m   Visual Acuity Right Eye Distance:   Left Eye Distance:   Bilateral Distance:    Right Eye Near:   Left Eye Near:    Bilateral Near:     Physical Exam Constitutional:      Appearance: She is diaphoretic.     Comments: Alert.  Appears pale and mildly ill.  HENT:     Head: Normocephalic and atraumatic.  Eyes:     General:        Right eye: No discharge.        Left eye: No discharge.     Conjunctiva/sclera: Conjunctivae normal.  Cardiovascular:     Rate and Rhythm: Normal rate and regular rhythm.  Pulmonary:     Effort: Pulmonary effort is normal. No respiratory distress.  Abdominal:     General: There is no distension.     Palpations: Abdomen is soft.  Psychiatric:        Mood and Affect: Mood normal.        Behavior: Behavior normal.    UC Treatments / Results  Labs  (all labs ordered are listed, but only abnormal results are displayed) Labs Reviewed - No data to display  EKG   Radiology No results found.  Procedures Procedures (including critical care time)  Medications Ordered in UC Medications - No data to display  Initial Impression / Assessment and Plan / UC Course  I have reviewed the triage vital signs and the nursing notes.  Pertinent labs & imaging results that were available  during my care of the patient were reviewed by me and considered in my medical decision making (see chart for details).    68 year old female presents with sudden onset nausea, vomiting, diarrhea.  Patient hypotensive.  Patient going to the hospital via EMS.   Final Clinical Impressions(s) / UC Diagnoses   Final diagnoses:  Nausea vomiting and diarrhea  Hypotension due to hypovolemia   Discharge Instructions   None    ED Prescriptions    None     Controlled Substance Prescriptions [place] Controlled Substance Registry consulted? Not Applicable   Coral Spikes, DO 11/20/18 1644

## 2018-11-21 LAB — BASIC METABOLIC PANEL
Anion gap: 8 (ref 5–15)
BUN: 41 mg/dL — ABNORMAL HIGH (ref 8–23)
CO2: 24 mmol/L (ref 22–32)
Calcium: 8.9 mg/dL (ref 8.9–10.3)
Chloride: 105 mmol/L (ref 98–111)
Creatinine, Ser: 1.55 mg/dL — ABNORMAL HIGH (ref 0.44–1.00)
GFR calc Af Amer: 39 mL/min — ABNORMAL LOW (ref >60)
GFR calc non Af Amer: 34 mL/min — ABNORMAL LOW (ref >60)
Glucose, Bld: 114 mg/dL — ABNORMAL HIGH (ref 70–99)
Potassium: 3.6 mmol/L (ref 3.5–5.1)
Sodium: 137 mmol/L (ref 135–145)

## 2018-11-21 LAB — CBC
HCT: 31 % — ABNORMAL LOW (ref 36.0–46.0)
Hemoglobin: 10.1 g/dL — ABNORMAL LOW (ref 12.0–15.0)
MCH: 29.3 pg (ref 26.0–34.0)
MCHC: 32.6 g/dL (ref 30.0–36.0)
MCV: 89.9 fL (ref 80.0–100.0)
Platelets: 206 10*3/uL (ref 150–400)
RBC: 3.45 MIL/uL — ABNORMAL LOW (ref 3.87–5.11)
RDW: 13.2 % (ref 11.5–15.5)
WBC: 10.9 10*3/uL — ABNORMAL HIGH (ref 4.0–10.5)
nRBC: 0 % (ref 0.0–0.2)

## 2018-11-21 LAB — SARS CORONAVIRUS 2 (TAT 6-24 HRS): SARS Coronavirus 2: NEGATIVE

## 2018-11-21 MED ORDER — LOPERAMIDE HCL 2 MG PO CAPS
2.0000 mg | ORAL_CAPSULE | Freq: Four times a day (QID) | ORAL | Status: DC | PRN
  Administered 2018-11-21 – 2018-11-22 (×5): 2 mg via ORAL
  Filled 2018-11-21 (×5): qty 1

## 2018-11-21 MED ORDER — ENOXAPARIN SODIUM 40 MG/0.4ML ~~LOC~~ SOLN
40.0000 mg | Freq: Two times a day (BID) | SUBCUTANEOUS | Status: DC
  Filled 2018-11-21 (×2): qty 0.4

## 2018-11-21 MED ORDER — ALUM & MAG HYDROXIDE-SIMETH 200-200-20 MG/5ML PO SUSP
30.0000 mL | Freq: Four times a day (QID) | ORAL | Status: DC | PRN
  Administered 2018-11-21: 30 mL via ORAL
  Filled 2018-11-21: qty 30

## 2018-11-21 NOTE — Progress Notes (Signed)
[facility] [place] [facility]     PATIENT NAME: Laurie Pennington    MR#:  [id number]  DATE OF BIRTH:  25-Jun-1950  SUBJECTIVE:   Patient presented to the hospital due to nausea vomiting and diarrhea and spectated enterocolitis/gastroenteritis.  Still having some diarrhea but no nausea or vomiting.  No other acute events overnight.  REVIEW OF SYSTEMS:    Review of Systems  Constitutional: Negative for chills and fever.  HENT: Negative for congestion and tinnitus.   Eyes: Negative for blurred vision and double vision.  Respiratory: Negative for cough, shortness of breath and wheezing.   Cardiovascular: Negative for chest pain, orthopnea and PND.  Gastrointestinal: Positive for diarrhea. Negative for abdominal pain, nausea and vomiting.  Genitourinary: Negative for dysuria and hematuria.  Neurological: Negative for dizziness, sensory change and focal weakness.  All other systems reviewed and are negative.   Nutrition: Clear Liquids Tolerating Diet: Yes Tolerating PT: Ambulatory  DRUG ALLERGIES:   Allergies  Allergen Reactions  . Other Nausea And Vomiting  . Atorvastatin Other (See Comments)    Stopped 11/2017 and myalgias resolved  . Metronidazole Nausea Only  . Percocet [Oxycodone-Acetaminophen] Nausea And Vomiting    VITALS:  Blood pressure 125/63, pulse 73, temperature 99.1 F (37.3 C), temperature source Oral, resp. rate 18, height 5' 3.5" (1.613 m), weight 106.6 kg, SpO2 100 %.  PHYSICAL EXAMINATION:   Physical Exam  GENERAL:  68 y.o.-year-old obese patient lying in bed in no acute distress.  EYES: Pupils equal, round, reactive to light and accommodation. No scleral icterus. Extraocular muscles intact.  HEENT: Head atraumatic, normocephalic. Oropharynx and nasopharynx clear.  NECK:  Supple, no jugular venous distention. No thyroid enlargement, no tenderness.  LUNGS: Normal breath sounds bilaterally, no wheezing, rales, rhonchi. No use of  accessory muscles of respiration.  CARDIOVASCULAR: S1, S2 normal. No murmurs, rubs, or gallops.  ABDOMEN: Soft, nontender, nondistended. Bowel sounds present. No organomegaly or mass.  EXTREMITIES: No cyanosis, clubbing or edema b/l.    NEUROLOGIC: Cranial nerves II through XII are intact. No focal Motor or sensory deficits b/l.   PSYCHIATRIC: The patient is alert and oriented x 3.  SKIN: No obvious rash, lesion, or ulcer.    LABORATORY PANEL:   CBC Recent Labs  Lab 11/21/18 0414  WBC 10.9*  HGB 10.1*  HCT 31.0*  PLT 206   ------------------------------------------------------------------------------------------------------------------  Chemistries  Recent Labs  Lab 11/20/18 1729 11/21/18 0414  NA 139 137  K 3.8 3.6  CL 99 105  CO2 25 24  GLUCOSE 136* 114*  BUN 45* 41*  CREATININE 2.25* 1.55*  CALCIUM 10.1 8.9  AST 22  --   ALT 16  --   ALKPHOS 75  --   BILITOT 0.6  --    ------------------------------------------------------------------------------------------------------------------  Cardiac Enzymes No results for input(s): TROPONINI in the last 168 hours. ------------------------------------------------------------------------------------------------------------------  RADIOLOGY:  Ct Abdomen Pelvis Wo Contrast  Result Date: 11/20/2018 CLINICAL DATA:  Lower abdominal cramping, nausea vomiting and diarrhea. EXAM: CT ABDOMEN AND PELVIS WITHOUT CONTRAST TECHNIQUE: Multidetector CT imaging of the abdomen and pelvis was performed following the standard protocol without IV contrast. COMPARISON:  Aug 15, 2009 FINDINGS: Lower chest: Calcific atherosclerotic disease of the coronary arteries. Normal heart size. Small pericardial effusion. Small hiatal hernia. Hepatobiliary: No focal liver abnormality is seen. 2.1 cm gallstone in the neck of the gallbladder. No gallbladder wall thickening, or biliary dilatation. Pancreas: Unremarkable. No pancreatic ductal dilatation or  surrounding inflammatory changes. Spleen:  Normal in size without focal abnormality. Adrenals/Urinary Tract: Adrenal glands are unremarkable. Kidneys are normal, without renal calculi, focal lesion, or hydronephrosis. Bladder is unremarkable. Stomach/Bowel: Stomach is within normal limits. Appendix appears normal. No evidence of bowel wall thickening, distention, or inflammatory changes. Vascular/Lymphatic: Aortic atherosclerosis. No enlarged abdominal or pelvic lymph nodes. Reproductive: Status post hysterectomy. No adnexal masses. Other: Central mesentery fat stranding with scattered sub pathologic by CT criteria lymph nodes within the area of mesenteric stranding, images 39-53/96, sequence 2. Musculoskeletal: Spondylosis of the lumbosacral spine with posterior facet arthropathy in the lower lumbosacral spine. IMPRESSION: 1. Cholelithiasis without CT evidence of acute cholecystitis. 2. Central mesentery fat stranding with scattered sub pathologic by CT criteria lymph nodes within the area of mesenteric stranding. This is a nonspecific finding and may be reactive changes due to transient enterocolitis. Mesenteric panniculitis is a secondary consideration. 3. Questionable asymmetric thickening of the sigmoid colon. This may represent colitis, however the abnormality is somewhat focal and therefore correlation with colonoscopy to exclude underlying colonic mass is recommended. 4. Small hiatal hernia. 5. Calcific atherosclerotic disease of the coronary arteries and aorta. Electronically Signed   By: Fidela Salisbury M.D.   On: 11/20/2018 18:49     ASSESSMENT AND PLAN:   Laurie Pennington  is a 68 y.o. female with a known history of hypertension who is independent at baseline comes from home secondary to abdominal pain, nausea vomiting and diarrhea that started this afternoon along with a presyncopal episode.  1.  Near syncopal episode-secondary to hypotension and vasovagal after severe vomiting's. -No further  episodes overnight and clinically much improved.  Hypotension has resolved.  2.  Acute renal failure-likely prerenal from hypovolemia and hypotension. -Improved with IV fluid hydration and as patient's blood pressures have improved.  Continue to hold meloxicam, lisinopril and chlorthalidone  3.  Acute gastroenteritis-likely food poisoning.  Since started within 1 to 2 hours after eating with projectile vomitus -Patient continues to have some diarrhea.  Stool for C. difficile and gastrointestinal PCR all negative. -Continue supportive care with IV fluids, antiemetics, will start some Imodium.  4.  Hypertension - cont. To hold anti-HTN for now given relative hypotension.     All the records are reviewed and case discussed with Care Management/Social Worker. Management plans discussed with the patient, family and they are in agreement.  CODE STATUS: Full code  DVT Prophylaxis: Lovenox  TOTAL TIME TAKING CARE OF THIS PATIENT: 30 minutes.   POSSIBLE D/C IN 1-2 DAYS, DEPENDING ON CLINICAL CONDITION.   Henreitta Leber M.D on 11/21/2018 at 1:30 PM  Between 7am to 6pm - Pager - [phone]  After 6pm go to [email or web address] - Technical brewer  [facility]ospital[facility]  Office  [phone]  CC: Primary care physician; Clarisse Gouge, MD

## 2018-11-21 NOTE — Progress Notes (Signed)
Anticoagulation monitoring(Lovenox):  68yo  F ordered Lovenox 30 mg Q24h  Filed Weights   11/20/18 1711  Weight: 235 lb (106.6 kg)   BMI 40.97   Lab Results  Component Value Date   CREATININE 1.55 (H) 11/21/2018   CREATININE 2.25 (H) 11/20/2018   CREATININE 0.91 11/05/2013   Estimated Creatinine Clearance: 40.4 mL/min (A) (by C-G formula based on SCr of 1.55 mg/dL (H)). Hemoglobin & Hematocrit     Component Value Date/Time   HGB 10.1 (L) 11/21/2018 0414   HGB 12.6 11/05/2013 1729   HCT 31.0 (L) 11/21/2018 0414   HCT 38.9 11/05/2013 1729     Per Protocol for Patient with estCrcl > 30 ml/min and BMI > 40, will transition to Lovenox 40 mg Q12h.     Chinita Greenland PharmD Clinical Pharmacist 11/21/2018

## 2018-11-21 NOTE — Plan of Care (Signed)
Pt reported LLQ dull pain 4 out of 10, declined further action. Pt tolerates clear liquid diet. Denies n/v. Pt had diarrheax3 in am.  Imodium given once with improvement. VSS.

## 2018-11-22 ENCOUNTER — Encounter: Payer: Self-pay | Admitting: Specialist

## 2018-11-22 LAB — BASIC METABOLIC PANEL
Anion gap: 7 (ref 5–15)
BUN: 18 mg/dL (ref 8–23)
CO2: 25 mmol/L (ref 22–32)
Calcium: 8.9 mg/dL (ref 8.9–10.3)
Chloride: 108 mmol/L (ref 98–111)
Creatinine, Ser: 0.93 mg/dL (ref 0.44–1.00)
GFR calc Af Amer: >60 mL/min (ref >60)
GFR calc non Af Amer: >60 mL/min (ref >60)
Glucose, Bld: 102 mg/dL — ABNORMAL HIGH (ref 70–99)
Potassium: 3.5 mmol/L (ref 3.5–5.1)
Sodium: 140 mmol/L (ref 135–145)

## 2018-11-22 LAB — HIV ANTIBODY (ROUTINE TESTING W REFLEX): HIV Screen 4th Generation wRfx: NONREACTIVE

## 2018-11-22 NOTE — Progress Notes (Addendum)
Pt is ready for dc, dc documentation provided, VSs WNL, husband at bedside.

## 2018-11-22 NOTE — Discharge Summary (Signed)
[facility] [place] [facility]   PATIENT NAME: Laurie Pennington    MR#:  [id number]  DATE OF BIRTH:  April 26, 1950  DATE OF ADMISSION:  11/20/2018 ADMITTING PHYSICIAN: Enid Baasadhika Kalisetti, MD  DATE OF DISCHARGE: 11/22/2018  PRIMARY CARE PHYSICIAN: Verner MouldFowler, Vickie A, MD    ADMISSION DIAGNOSIS:  Dehydration [E86.0] Enterocolitis [K52.9] AKI (acute kidney injury) (HCC) [N17.9]  DISCHARGE DIAGNOSIS:  Active Problems:   ARF (acute renal failure) (HCC)   SECONDARY DIAGNOSIS:   Past Medical History:  Diagnosis Date  . Hypertension     HOSPITAL COURSE:   ElaineWalkeris a68 y.o.femalewith a known history of hypertension who is independent at baseline comes from home secondary to abdominal pain, nausea vomiting and diarrhea that started this afternoon along with a presyncopal episode.  1. Near syncopal episode-secondary to hypotension and vasovagal after severe vomiting's. -After IV fluid hydration patient has had no further episodes of syncope.  Hypotension has resolved.  Patient is stable for discharge home.  2. Acute renal failure-likely prerenal from hypovolemia and hypotension. -Patient was aggressively hydrated with IV fluids and has improved.  Patient's creatinine has now normalized.  Her chlorthalidone, lisinopril and meloxicam were held but they can be resumed upon discharge.  3. Acute gastroenteritis-this is likely viral in nature.  Patient stool for comprehensive culture and C. difficile was negative.  Patient was treated supportively with IV fluids, antiemetics and given some Imodium.  Diarrhea has improved.  Diet has been slowly advanced from clear liquids to soft diet which patient is tolerating well. - Will discharge home today.  4. Hypertension -the patient was hypotensive but blood pressures have improved now and patient will resume her antihypertensives upon discharge.  DISCHARGE CONDITIONS:   Stable.   CONSULTS OBTAINED:    DRUG  ALLERGIES:   Allergies  Allergen Reactions  . Other Nausea And Vomiting  . Atorvastatin Other (See Comments)    Stopped 11/2017 and myalgias resolved  . Metronidazole Nausea Only  . Percocet [Oxycodone-Acetaminophen] Nausea And Vomiting    DISCHARGE MEDICATIONS:   Allergies as of 11/22/2018      Reactions   Other Nausea And Vomiting   Atorvastatin Other (See Comments)   Stopped 11/2017 and myalgias resolved   Metronidazole Nausea Only   Percocet [oxycodone-acetaminophen] Nausea And Vomiting      Medication List    TAKE these medications   chlorthalidone 25 MG tablet Commonly known as: HYGROTON Take 25 mg by mouth daily.   escitalopram 20 MG tablet Commonly known as: LEXAPRO Take 20 mg by mouth daily.   Glucosamine Chondr 1500 Complx Caps Take 1 capsule by mouth as directed.   levothyroxine 25 MCG tablet Commonly known as: SYNTHROID Take 25 mcg by mouth daily.   lisinopril 5 MG tablet Commonly known as: ZESTRIL Take 5 mg by mouth daily.   LORazepam 0.5 MG tablet Commonly known as: ATIVAN Take 0.5 mg by mouth 2 (two) times daily as needed for anxiety.   meloxicam 15 MG tablet Commonly known as: MOBIC Take 15 mg by mouth daily.   montelukast 10 MG tablet Commonly known as: SINGULAIR Take 10 mg by mouth at bedtime.   Multi-Vitamin tablet Take 1 tablet by mouth daily.   rosuvastatin 5 MG tablet Commonly known as: CRESTOR Take 5 mg by mouth at bedtime.   traMADol 50 MG tablet Commonly known as: ULTRAM Take 50 mg by mouth every 6 (six) hours as needed for moderate pain.   Vitamin D 50 MCG (2000 UT) tablet  Take 2,000 Units by mouth daily.         DISCHARGE INSTRUCTIONS:   DIET:  Cardiac diet  DISCHARGE CONDITION:  Stable  ACTIVITY:  Activity as tolerated  OXYGEN:  Home Oxygen: No.   Oxygen Delivery: room air  DISCHARGE LOCATION:  home   If you experience worsening of your admission symptoms, develop shortness of breath, life  threatening emergency, suicidal or homicidal thoughts you must seek medical attention immediately by calling 911 or calling your MD immediately  if symptoms less severe.  You Must read complete instructions/literature along with all the possible adverse reactions/side effects for all the Medicines you take and that have been prescribed to you. Take any new Medicines after you have completely understood and accpet all the possible adverse reactions/side effects.   Please note  You were cared for by a hospitalist during your hospital stay. If you have any questions about your discharge medications or the care you received while you were in the hospital after you are discharged, you can call the unit and asked to speak with the hospitalist on call if the hospitalist that took care of you is not available. Once you are discharged, your primary care physician will handle any further medical issues. Please note that NO REFILLS for any discharge medications will be authorized once you are discharged, as it is imperative that you return to your primary care physician (or establish a relationship with a primary care physician if you do not have one) for your aftercare needs so that they can reassess your need for medications and monitor your lab values.     Today   Diarrhea has improved.  Patient has no nausea or vomiting.  She overall feels better.  Renal function has normalized.  Will advance diet and if tolerating well will discharge home later today.  VITAL SIGNS:  Blood pressure 133/73, pulse 65, temperature 98.6 F (37 C), temperature source Axillary, resp. rate 16, height 5' 3.5" (1.613 m), weight 106.6 kg, SpO2 100 %.  I/O:    Intake/Output Summary (Last 24 hours) at 11/22/2018 1400 Last data filed at 11/22/2018 1006 Gross per 24 hour  Intake 1413.02 ml  Output -  Net 1413.02 ml    PHYSICAL EXAMINATION:  GENERAL:  68 y.o.-year-old patient lying in the bed with no acute distress.  EYES:  Pupils equal, round, reactive to light and accommodation. No scleral icterus. Extraocular muscles intact.  HEENT: Head atraumatic, normocephalic. Oropharynx and nasopharynx clear.  NECK:  Supple, no jugular venous distention. No thyroid enlargement, no tenderness.  LUNGS: Normal breath sounds bilaterally, no wheezing, rales,rhonchi. No use of accessory muscles of respiration.  CARDIOVASCULAR: S1, S2 normal. No murmurs, rubs, or gallops.  ABDOMEN: Soft, non-tender, non-distended. Bowel sounds present. No organomegaly or mass.  EXTREMITIES: No pedal edema, cyanosis, or clubbing.  NEUROLOGIC: Cranial nerves II through XII are intact. No focal motor or sensory defecits b/l.  PSYCHIATRIC: The patient is alert and oriented x 3.  SKIN: No obvious rash, lesion, or ulcer.   DATA REVIEW:   CBC Recent Labs  Lab 11/21/18 0414  WBC 10.9*  HGB 10.1*  HCT 31.0*  PLT 206    Chemistries  Recent Labs  Lab 11/20/18 1729  11/22/18 0415  NA 139   < > 140  K 3.8   < > 3.5  CL 99   < > 108  CO2 25   < > 25  GLUCOSE 136*   < > 102*  BUN 45*   < >  18  CREATININE 2.25*   < > 0.93  CALCIUM 10.1   < > 8.9  AST 22  --   --   ALT 16  --   --   ALKPHOS 75  --   --   BILITOT 0.6  --   --    < > = values in this interval not displayed.    Cardiac Enzymes No results for input(s): TROPONINI in the last 168 hours.  Microbiology Results  Results for orders placed or performed during the hospital encounter of 11/20/18  Gastrointestinal Panel by PCR , Stool     Status: None   Collection Time: 11/20/18  7:13 PM   Specimen: Stool  Result Value Ref Range Status   Campylobacter species NOT DETECTED NOT DETECTED Final   Plesimonas shigelloides NOT DETECTED NOT DETECTED Final   Salmonella species NOT DETECTED NOT DETECTED Final   Yersinia enterocolitica NOT DETECTED NOT DETECTED Final   Vibrio species NOT DETECTED NOT DETECTED Final   Vibrio cholerae NOT DETECTED NOT DETECTED Final   Enteroaggregative E  coli (EAEC) NOT DETECTED NOT DETECTED Final   Enteropathogenic E coli (EPEC) NOT DETECTED NOT DETECTED Final   Enterotoxigenic E coli (ETEC) NOT DETECTED NOT DETECTED Final   Shiga like toxin producing E coli (STEC) NOT DETECTED NOT DETECTED Final   Shigella/Enteroinvasive E coli (EIEC) NOT DETECTED NOT DETECTED Final   Cryptosporidium NOT DETECTED NOT DETECTED Final   Cyclospora cayetanensis NOT DETECTED NOT DETECTED Final   Entamoeba histolytica NOT DETECTED NOT DETECTED Final   Giardia lamblia NOT DETECTED NOT DETECTED Final   Adenovirus F40/41 NOT DETECTED NOT DETECTED Final   Astrovirus NOT DETECTED NOT DETECTED Final   Norovirus GI/GII NOT DETECTED NOT DETECTED Final   Rotavirus A NOT DETECTED NOT DETECTED Final   Sapovirus (I, II, IV, and V) NOT DETECTED NOT DETECTED Final    Comment: Performed at [facility], [address] [place], [place] [address]  C difficile quick scan w PCR reflex     Status: None   Collection Time: 11/20/18  7:13 PM   Specimen: Stool  Result Value Ref Range Status   C Diff antigen NEGATIVE NEGATIVE Final   C Diff toxin NEGATIVE NEGATIVE Final   C Diff interpretation No C. difficile detected.  Final    Comment: Performed at [facility], [place], [place] [address]  SARS CORONAVIRUS 2 Nasal Swab Aptima Multi Swab     Status: None   Collection Time: 11/20/18  8:54 PM   Specimen: Aptima Multi Swab; Nasal Swab  Result Value Ref Range Status   SARS Coronavirus 2 NEGATIVE NEGATIVE Final    Comment: (NOTE) SARS-CoV-2 target nucleic acids are NOT DETECTED. The SARS-CoV-2 RNA is generally detectable in upper and lower respiratory specimens during the acute phase of infection. Negative results do not preclude SARS-CoV-2 infection, do not rule out co-infections with other pathogens, and should not be used as the sole basis for treatment or other patient management decisions. Negative results must be combined with clinical  observations, patient history, and epidemiological information. The expected result is Negative. Fact Sheet for Patients: SugarRoll.be Fact Sheet for Healthcare Providers: https://www.woods-mathews.com/ This test is not yet approved or cleared by the [place] FDA and  has been authorized for detection and/or diagnosis of SARS-CoV-2 by FDA under an Emergency Use Authorization (EUA). This EUA will remain  in effect (meaning this test can be used) for the duration of the COVID-19  declaration under Section 56 4(b)(1) of the Act, 21 U.S.C. section 360bbb-3(b)(1), unless the authorization is terminated or revoked sooner. Performed at [facility], [address] [place], [place] [address]     RADIOLOGY:  Ct Abdomen Pelvis Wo Contrast  Result Date: 11/20/2018 CLINICAL DATA:  Lower abdominal cramping, nausea vomiting and diarrhea. EXAM: CT ABDOMEN AND PELVIS WITHOUT CONTRAST TECHNIQUE: Multidetector CT imaging of the abdomen and pelvis was performed following the standard protocol without IV contrast. COMPARISON:  Aug 15, 2009 FINDINGS: Lower chest: Calcific atherosclerotic disease of the coronary arteries. Normal heart size. Small pericardial effusion. Small hiatal hernia. Hepatobiliary: No focal liver abnormality is seen. 2.1 cm gallstone in the neck of the gallbladder. No gallbladder wall thickening, or biliary dilatation. Pancreas: Unremarkable. No pancreatic ductal dilatation or surrounding inflammatory changes. Spleen: Normal in size without focal abnormality. Adrenals/Urinary Tract: Adrenal glands are unremarkable. Kidneys are normal, without renal calculi, focal lesion, or hydronephrosis. Bladder is unremarkable. Stomach/Bowel: Stomach is within normal limits. Appendix appears normal. No evidence of bowel wall thickening, distention, or inflammatory changes. Vascular/Lymphatic: Aortic atherosclerosis. No enlarged abdominal or pelvic lymph  nodes. Reproductive: Status post hysterectomy. No adnexal masses. Other: Central mesentery fat stranding with scattered sub pathologic by CT criteria lymph nodes within the area of mesenteric stranding, images 39-53/96, sequence 2. Musculoskeletal: Spondylosis of the lumbosacral spine with posterior facet arthropathy in the lower lumbosacral spine. IMPRESSION: 1. Cholelithiasis without CT evidence of acute cholecystitis. 2. Central mesentery fat stranding with scattered sub pathologic by CT criteria lymph nodes within the area of mesenteric stranding. This is a nonspecific finding and may be reactive changes due to transient enterocolitis. Mesenteric panniculitis is a secondary consideration. 3. Questionable asymmetric thickening of the sigmoid colon. This may represent colitis, however the abnormality is somewhat focal and therefore correlation with colonoscopy to exclude underlying colonic mass is recommended. 4. Small hiatal hernia. 5. Calcific atherosclerotic disease of the coronary arteries and aorta. Electronically Signed   By: Ted Mcalpineobrinka  Dimitrova M.D.   On: 11/20/2018 18:49      Management plans discussed with the patient, family and they are in agreement.  CODE STATUS:     Code Status Orders  (From admission, onward)         Start     Ordered   11/20/18 2225  Full code  Continuous     11/20/18 2224         TOTAL TIME TAKING CARE OF THIS PATIENT: 40 minutes.    Houston SirenVivek J Alonda Weaber M.D on 11/22/2018 at 2:00 PM  Between 7am to 6pm - Pager - [phone]  After 6pm go to [email or web address] - Scientist, research (life sciences)password [email or web address]  [facility] [place] [facility]  Office  [phone]  CC: Primary care physician; Verner MouldFowler, Vickie A, MD

## 2018-12-03 ENCOUNTER — Ambulatory Visit
Admission: RE | Admit: 2018-12-03 | Discharge: 2018-12-03 | Disposition: A | Discharge status: Home / Self Care | Payer: Medicare HMO | Source: Ambulatory Visit | Attending: Family Medicine | Admitting: Family Medicine

## 2018-12-03 DIAGNOSIS — Z1231 Encounter for screening mammogram for malignant neoplasm of breast: Secondary | ICD-10-CM | POA: Insufficient documentation

## 2019-04-04 ENCOUNTER — Ambulatory Visit: Payer: Medicare HMO | Attending: Internal Medicine

## 2019-04-04 DIAGNOSIS — Z20822 Contact with and (suspected) exposure to covid-19: Secondary | ICD-10-CM

## 2019-04-05 LAB — NOVEL CORONAVIRUS, NAA: SARS-CoV-2, NAA: NOT DETECTED

## 2019-05-10 ENCOUNTER — Ambulatory Visit: Payer: Medicare HMO

## 2019-05-19 ENCOUNTER — Ambulatory Visit: Payer: Medicare HMO | Attending: Internal Medicine

## 2019-05-19 DIAGNOSIS — Z23 Encounter for immunization: Secondary | ICD-10-CM | POA: Insufficient documentation

## 2019-05-19 NOTE — Progress Notes (Signed)
   Covid-19 Vaccination Clinic  Name:  Laurie Pennington    MRN: [id number] DOB: 10/11/1950  05/19/2019  Ms. Laurie Pennington was observed post Covid-19 immunization for 15 minutes without incidence. She was provided with Vaccine Information Sheet and instruction to access the V-Safe system.   Ms. Laurie Pennington was instructed to call 911 with any severe reactions post vaccine: Marland Kitchen Difficulty breathing  . Swelling of your face and throat  . A fast heartbeat  . A bad rash all over your body  . Dizziness and weakness    Immunizations Administered    Name Date Dose VIS Date Route   Pfizer COVID-19 Vaccine 05/19/2019  5:20 PM 0.3 mL 03/25/2019 Intramuscular   Manufacturer: [employer], [employer]   Lot: [id number]   NDC: [address]-4

## 2019-06-01 ENCOUNTER — Ambulatory Visit: Payer: Medicare HMO

## 2019-06-14 ENCOUNTER — Ambulatory Visit: Payer: Medicare HMO | Attending: Internal Medicine

## 2019-06-14 DIAGNOSIS — Z23 Encounter for immunization: Secondary | ICD-10-CM | POA: Insufficient documentation

## 2019-06-14 NOTE — Progress Notes (Signed)
   Covid-19 Vaccination Clinic  Name:  Laurie Pennington    MRN: [id number] DOB: 1951-03-27  06/14/2019  Ms. Laurie Pennington was observed post Covid-19 immunization for 15 minutes without incident. She was provided with Vaccine Information Sheet and instruction to access the V-Safe system.   Ms. Laurie Pennington was instructed to call 911 with any severe reactions post vaccine: Marland Kitchen Difficulty breathing  . Swelling of face and throat  . A fast heartbeat  . A bad rash all over body  . Dizziness and weakness   Immunizations Administered    Name Date Dose VIS Date Route   Pfizer COVID-19 Vaccine 06/14/2019  9:46 AM 0.3 mL 03/25/2019 Intramuscular   Manufacturer: [employer], [employer]   Lot: [id number]   NDC: [address]-4

## 2019-10-31 ENCOUNTER — Other Ambulatory Visit: Payer: Self-pay | Admitting: Student

## 2019-10-31 ENCOUNTER — Other Ambulatory Visit: Payer: Self-pay | Admitting: Family Medicine

## 2019-10-31 DIAGNOSIS — Z1231 Encounter for screening mammogram for malignant neoplasm of breast: Secondary | ICD-10-CM

## 2019-11-22 ENCOUNTER — Telehealth (INDEPENDENT_AMBULATORY_CARE_PROVIDER_SITE_OTHER): Payer: Self-pay | Admitting: Gastroenterology

## 2019-11-22 ENCOUNTER — Other Ambulatory Visit: Payer: Self-pay

## 2019-11-22 DIAGNOSIS — Z1211 Encounter for screening for malignant neoplasm of colon: Secondary | ICD-10-CM

## 2019-11-22 MED ORDER — GOLYTELY 236 G PO SOLR: 4000.0000 mL | Freq: Once | ORAL | 0 refills | Status: AC

## 2019-11-22 NOTE — Progress Notes (Signed)
Gastroenterology Pre-Procedure Review  Request Date: Tuesday 12/13/19 Requesting Physician: Dr. Allegra Lai  PATIENT REVIEW QUESTIONS: The patient responded to the following health history questions as indicated:    1. Are you having any GI issues? 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  Recently lost her husband to bone cancer.) 2. Do you have a personal history of Polyps? no 3. Do you have a family history of Colon Cancer or Polyps? no 4. Diabetes Mellitus? no 5. Joint replacements in the past 12 months?no 6. Major health problems in the past 3 months?no 7. Any artificial heart valves, MVP, or defibrillator?no    MEDICATIONS & ALLERGIES:    Patient reports the following regarding taking any anticoagulation/antiplatelet therapy:   Plavix, Coumadin, Eliquis, Xarelto, Lovenox, Pradaxa, Brilinta, or Effient? no Aspirin? no  Patient confirms/reports the following medications:  Current Outpatient Medications  Medication Sig Dispense Refill   Cholecalciferol (VITAMIN D) 50 MCG (2000 UT) tablet Take 2,000 Units by mouth daily.     escitalopram (LEXAPRO) 20 MG tablet Take 20 mg by mouth daily.      Glucosamine-Chondroit-Vit C-Mn (GLUCOSAMINE CHONDR 1500 COMPLX) CAPS Take 1 capsule by mouth as directed.     levothyroxine (SYNTHROID) 25 MCG tablet Take 25 mcg by mouth daily.      lisinopril (ZESTRIL) 5 MG tablet Take 5 mg by mouth daily.      LORazepam (ATIVAN) 0.5 MG tablet Take 0.5 mg by mouth 2 (two) times daily as needed for anxiety.      meloxicam (MOBIC) 15 MG tablet Take 15 mg by mouth daily.     montelukast (SINGULAIR) 10 MG tablet Take 10 mg by mouth at bedtime.      Multiple Vitamin (MULTI-VITAMIN) tablet Take 1 tablet by mouth daily.      rosuvastatin (CRESTOR) 5 MG tablet Take 5 mg by mouth at bedtime.      traMADol (ULTRAM) 50 MG tablet Take 50 mg by mouth every 6 (six) hours as needed for moderate pain.      TRAZODONE HCL PO Take by mouth.      chlorthalidone (HYGROTON) 25 MG tablet Take 25 mg by mouth daily.  (Patient not taking: Reported on 11/22/2019)     polyethylene glycol (GOLYTELY) 236 g solution Take 4,000 mLs by mouth once for 1 dose. 4000 mL 0   No current facility-administered medications for this visit.    Patient confirms/reports the following allergies:  Allergies  Allergen Reactions   Other Nausea And Vomiting   Atorvastatin Other (See Comments)    Stopped 11/2017 and myalgias resolved   Metronidazole Nausea Only   Percocet [Oxycodone-Acetaminophen] Nausea And Vomiting    No orders of the defined types were placed in this encounter.   AUTHORIZATION INFORMATION Primary Insurance: 1D#: Group #:  Secondary Insurance: 1D#: Group #:  SCHEDULE INFORMATION: Date: Tuesday 12/13/19 Time: Location:[facility]

## 2019-12-08 ENCOUNTER — Encounter: Payer: Self-pay | Admitting: Gastroenterology

## 2019-12-09 ENCOUNTER — Other Ambulatory Visit
Admission: RE | Admit: 2019-12-09 | Discharge: 2019-12-09 | Disposition: A | Discharge status: Home / Self Care | Payer: Medicare HMO | Source: Ambulatory Visit | Attending: Gastroenterology | Admitting: Gastroenterology

## 2019-12-09 ENCOUNTER — Other Ambulatory Visit: Payer: Self-pay

## 2019-12-09 ENCOUNTER — Ambulatory Visit
Admission: RE | Admit: 2019-12-09 | Discharge: 2019-12-09 | Disposition: A | Discharge status: Home / Self Care | Payer: Medicare HMO | Source: Ambulatory Visit | Attending: Student | Admitting: Student

## 2019-12-09 DIAGNOSIS — Z20822 Contact with and (suspected) exposure to covid-19: Secondary | ICD-10-CM | POA: Diagnosis not present

## 2019-12-09 DIAGNOSIS — Z1231 Encounter for screening mammogram for malignant neoplasm of breast: Secondary | ICD-10-CM | POA: Insufficient documentation

## 2019-12-09 LAB — SARS CORONAVIRUS 2 (TAT 6-24 HRS): SARS Coronavirus 2: NEGATIVE

## 2019-12-13 ENCOUNTER — Other Ambulatory Visit: Payer: Self-pay

## 2019-12-13 ENCOUNTER — Ambulatory Visit
Admission: RE | Admit: 2019-12-13 | Discharge: 2019-12-13 | Disposition: A | Discharge status: Home / Self Care | Payer: Medicare HMO | Attending: Gastroenterology | Admitting: Gastroenterology

## 2019-12-13 ENCOUNTER — Encounter: Payer: Self-pay | Admitting: Gastroenterology

## 2019-12-13 ENCOUNTER — Ambulatory Visit: Payer: Medicare HMO | Admitting: Anesthesiology

## 2019-12-13 ENCOUNTER — Encounter
Admission: RE | Disposition: A | Discharge status: Home / Self Care | Payer: Self-pay | Source: Home / Self Care | Attending: Gastroenterology

## 2019-12-13 DIAGNOSIS — K644 Residual hemorrhoidal skin tags: Secondary | ICD-10-CM | POA: Diagnosis not present

## 2019-12-13 DIAGNOSIS — Z79899 Other long term (current) drug therapy: Secondary | ICD-10-CM | POA: Insufficient documentation

## 2019-12-13 DIAGNOSIS — E785 Hyperlipidemia, unspecified: Secondary | ICD-10-CM | POA: Insufficient documentation

## 2019-12-13 DIAGNOSIS — Z79890 Hormone replacement therapy (postmenopausal): Secondary | ICD-10-CM | POA: Diagnosis not present

## 2019-12-13 DIAGNOSIS — I10 Essential (primary) hypertension: Secondary | ICD-10-CM | POA: Diagnosis not present

## 2019-12-13 DIAGNOSIS — Z1211 Encounter for screening for malignant neoplasm of colon: Secondary | ICD-10-CM

## 2019-12-13 DIAGNOSIS — Z791 Long term (current) use of non-steroidal anti-inflammatories (NSAID): Secondary | ICD-10-CM | POA: Diagnosis not present

## 2019-12-13 DIAGNOSIS — K5730 Diverticulosis of large intestine without perforation or abscess without bleeding: Secondary | ICD-10-CM | POA: Insufficient documentation

## 2019-12-13 DIAGNOSIS — F419 Anxiety disorder, unspecified: Secondary | ICD-10-CM | POA: Insufficient documentation

## 2019-12-13 DIAGNOSIS — F329 Major depressive disorder, single episode, unspecified: Secondary | ICD-10-CM | POA: Insufficient documentation

## 2019-12-13 DIAGNOSIS — M1990 Unspecified osteoarthritis, unspecified site: Secondary | ICD-10-CM | POA: Diagnosis not present

## 2019-12-13 HISTORY — PX: COLONOSCOPY WITH PROPOFOL: SHX5780

## 2019-12-13 HISTORY — DX: Anxiety disorder, unspecified: F41.9

## 2019-12-13 HISTORY — DX: Depression, unspecified: F32.A

## 2019-12-13 SURGERY — COLONOSCOPY WITH PROPOFOL
Anesthesia: General

## 2019-12-13 MED ORDER — SODIUM CHLORIDE 0.9 % IV SOLN: INTRAVENOUS | Status: DC

## 2019-12-13 MED ORDER — PROPOFOL 10 MG/ML IV BOLUS
INTRAVENOUS | Status: AC
  Filled 2019-12-13: qty 20

## 2019-12-13 MED ORDER — LIDOCAINE HCL (PF) 2 % IJ SOLN
INTRAMUSCULAR | Status: AC
  Filled 2019-12-13: qty 5

## 2019-12-13 MED ORDER — LIDOCAINE HCL (CARDIAC) PF 100 MG/5ML IV SOSY
PREFILLED_SYRINGE | INTRAVENOUS | Status: DC | PRN
  Administered 2019-12-13: 50 mg via INTRAVENOUS

## 2019-12-13 MED ORDER — PROPOFOL 10 MG/ML IV BOLUS
INTRAVENOUS | Status: DC | PRN
  Administered 2019-12-13: 80 mg via INTRAVENOUS

## 2019-12-13 MED ORDER — PROPOFOL 500 MG/50ML IV EMUL
INTRAVENOUS | Status: DC | PRN
  Administered 2019-12-13: 150 ug/kg/min via INTRAVENOUS

## 2019-12-13 NOTE — Op Note (Signed)
[facility] Patient Name: Laurie Pennington Procedure Date: 12/13/2019 9:35 AM MRN: [id number] Account #: [id number] Date of Birth: Jan 01, 1951 Admit Type: Outpatient Age: 69 Room: [facility] ENDO ROOM 1 Gender: Female Note Status: Finalized Procedure:             Colonoscopy Indications:           Screening for colorectal malignant neoplasm, Last                         colonoscopy: July 2016 Providers:             Lin Landsman MD, MD Referring MD:          Rubbie Battiest. Iona Beard MD, MD (Referring MD) Medicines:             Monitored Anesthesia Care Complications:         No immediate complications. Estimated blood loss: None. Procedure:             Pre-Anesthesia Assessment:                        - Prior to the procedure, a History and Physical was                         performed, and patient medications and allergies were                         reviewed. The patient is competent. The risks and                         benefits of the procedure and the sedation options and                         risks were discussed with the patient. All questions                         were answered and informed consent was obtained.                         Patient identification and proposed procedure were                         verified by the physician, the nurse, the                         anesthesiologist, the anesthetist and the technician                         in the pre-procedure area in the procedure room in the                         endoscopy suite. Mental Status Examination: alert and                         oriented. Airway Examination: normal oropharyngeal                         airway and neck mobility. Respiratory Examination:  clear to auscultation. CV Examination: normal.                         Prophylactic Antibiotics: The patient does not require                         prophylactic antibiotics. Prior Anticoagulants:  The                         patient has taken no previous anticoagulant or                         antiplatelet agents. ASA Grade Assessment: II - A                         patient with mild systemic disease. After reviewing                         the risks and benefits, the patient was deemed in                         satisfactory condition to undergo the procedure. The                         anesthesia plan was to use monitored anesthesia care                         (MAC). Immediately prior to administration of                         medications, the patient was re-assessed for adequacy                         to receive sedatives. The heart rate, respiratory                         rate, oxygen saturations, blood pressure, adequacy of                         pulmonary ventilation, and response to care were                         monitored throughout the procedure. The physical                         status of the patient was re-assessed after the                         procedure.                        After obtaining informed consent, the colonoscope was                         passed under direct vision. Throughout the procedure,                         the patient's blood pressure, pulse, and oxygen  saturations were monitored continuously. The                         Colonoscope was introduced through the anus and                         advanced to the the cecum, identified by appendiceal                         orifice and ileocecal valve. The colonoscopy was                         unusually difficult due to multiple diverticula in the                         colon. Successful completion of the procedure was                         aided by withdrawing the scope and replacing with the                         pediatric colonoscope. The patient tolerated the                         procedure well. The quality of the bowel preparation                          was evaluated using the BBPS [facility] Bowel Preparation                         Scale) with scores of: Right Colon = 3, Transverse                         Colon = 3 and Left Colon = 3 (entire mucosa seen well                         with no residual staining, small fragments of stool or                         opaque liquid). The total BBPS score equals 9. Findings:      The perianal and digital rectal examinations were normal. Pertinent       negatives include normal sphincter tone and no palpable rectal lesions.      Multiple diverticula were found in the recto-sigmoid colon and sigmoid       colon.      Non-bleeding external hemorrhoids were found during retroflexion. The       hemorrhoids were large.      The entire examined colon appeared normal. Impression:            - Diverticulosis in the recto-sigmoid colon and in the                         sigmoid colon.                        - Non-bleeding external hemorrhoids.                        -  The entire examined colon is normal.                        - No specimens collected. Recommendation:        - Discharge patient to home (with escort).                        - Resume previous diet today.                        - Continue present medications.                        - Consider to Repeat colonoscopy in 10 years for                         screening purposes based on her medical condition at                         that time. Procedure Code(s):     --- Professional ---                        J2426, Colorectal cancer screening; colonoscopy on                         individual not meeting criteria for high risk Diagnosis Code(s):     --- Professional ---                        Z12.11, Encounter for screening for malignant neoplasm                         of colon                        K64.4, Residual hemorrhoidal skin tags                        K57.30, Diverticulosis of large intestine without                          perforation or abscess without bleeding CPT copyright 2019 American Medical Association. All rights reserved. The codes documented in this report are preliminary and upon coder review may  be revised to meet current compliance requirements. Dr. Ulyess Mort Lin Landsman MD, MD 12/13/2019 10:12:26 AM This report has been signed electronically. Number of Addenda: 0 Note Initiated On: 12/13/2019 9:35 AM Scope Withdrawal Time: 0 hours 7 minutes 55 seconds  Total Procedure Duration: 0 hours 22 minutes 56 seconds  Estimated Blood Loss:  Estimated blood loss: none.      [facility]

## 2019-12-13 NOTE — Transfer of Care (Signed)
Immediate Anesthesia Transfer of Care Note  Patient: Laurie Pennington  Procedure(s) Performed: COLONOSCOPY WITH PROPOFOL (N/A )  Patient Location: Endoscopy Unit  Anesthesia Type:General  Level of Consciousness: drowsy and patient cooperative  Airway & Oxygen Therapy: Patient Spontanous Breathing  Post-op Assessment: Report given to RN and Post -op Vital signs reviewed and stable  Post vital signs: Reviewed and stable  Last Vitals:  Vitals Value Taken Time  BP 100/60 12/13/19 1014  Temp 36.3 C 12/13/19 1014  Pulse 67 12/13/19 1015  Resp 13 12/13/19 1015  SpO2 98 % 12/13/19 1015  Vitals shown include unvalidated device data.  Last Pain:  Vitals:   12/13/19 0912  TempSrc: Temporal  PainSc: 0-No pain         Complications: No complications documented.

## 2019-12-13 NOTE — Anesthesia Postprocedure Evaluation (Signed)
Anesthesia Post Note  Patient: [name]  Procedure(s) Performed: COLONOSCOPY WITH PROPOFOL (N/A )  Patient location during evaluation: Endoscopy Anesthesia Type: General Level of consciousness: awake and alert and oriented Pain management: pain level controlled Vital Signs Assessment: post-procedure vital signs reviewed and stable Respiratory status: spontaneous breathing, nonlabored ventilation and respiratory function stable Cardiovascular status: blood pressure returned to baseline and stable Postop Assessment: no signs of nausea or vomiting Anesthetic complications: no   No complications documented.   Last Vitals:  Vitals:   12/13/19 1024 12/13/19 1034  BP: 121/71 136/82  Pulse: (!) 59 (!) 57  Resp: 13 13  Temp:    SpO2: 100% 100%    Last Pain:  Vitals:   12/13/19 1034  TempSrc:   PainSc: 0-No pain                 Muhanad Torosyan

## 2019-12-13 NOTE — Anesthesia Preprocedure Evaluation (Signed)
Anesthesia Evaluation  Patient identified by MRN, date of birth, ID band Patient awake    Reviewed: Allergy & Precautions, NPO status , Patient's Chart, lab work & pertinent test results  History of Anesthesia Complications Negative for: history of anesthetic complications  Airway Mallampati: III  TM Distance: >3 FB Neck ROM: Full    Dental no notable dental hx.    Pulmonary neg pulmonary ROS, neg sleep apnea, neg COPD,    breath sounds clear to auscultation- rhonchi (-) wheezing      Cardiovascular hypertension, Pt. on medications (-) CAD, (-) Past MI, (-) Cardiac Stents and (-) CABG  Rhythm:Regular Rate:Normal - Systolic murmurs and - Diastolic murmurs    Neuro/Psych neg Seizures PSYCHIATRIC DISORDERS Anxiety Depression negative neurological ROS     GI/Hepatic negative GI ROS, Neg liver ROS,   Endo/Other  neg diabetesHypothyroidism   Renal/GU negative Renal ROS     Musculoskeletal  (+) Arthritis ,   Abdominal (+) + obese,   Peds  Hematology negative hematology ROS (+)   Anesthesia Other Findings Past Medical History: No date: Anxiety No date: Depression No date: Hyperlipidemia No date: Hypertension No date: IBS (irritable bowel syndrome) No date: Osteoarthritis   Reproductive/Obstetrics                             Anesthesia Physical Anesthesia Plan  ASA: II  Anesthesia Plan: General   Post-op Pain Management:    Induction: Intravenous  PONV Risk Score and Plan: 2 and Propofol infusion  Airway Management Planned: Natural Airway  Additional Equipment:   Intra-op Plan:   Post-operative Plan:   Informed Consent: I have reviewed the patients History and Physical, chart, labs and discussed the procedure including the risks, benefits and alternatives for the proposed anesthesia with the patient or authorized representative who has indicated his/her understanding and  acceptance.     Dental advisory given  Plan Discussed with: CRNA and Anesthesiologist  Anesthesia Plan Comments:         Anesthesia Quick Evaluation

## 2019-12-13 NOTE — H&P (Signed)
Arlyss Repress, MD [address]  [address]  [place], [place] [address]  Main: [phone]  Fax: [phone] Pager: [phone]  Primary Care Physician:  Lynwood Dawley, MD Primary Gastroenterologist:  Dr. Arlyss Repress  Pre-Procedure History & Physical: HPI:  Laurie Pennington is a 69 y.o. female is here for an colonoscopy.   Past Medical History:  Diagnosis Date  . Anxiety   . Depression   . Hyperlipidemia   . Hypertension   . IBS (irritable bowel syndrome)   . Osteoarthritis     Past Surgical History:  Procedure Laterality Date  . ABDOMINAL HYSTERECTOMY    . BREAST EXCISIONAL BIOPSY Left 1991   neg  . COLONOSCOPY N/A 10/13/2014   Procedure: COLONOSCOPY;  Surgeon: Christena Deem, MD;  Location: [facility] ENDOSCOPY;  Service: Endoscopy;  Laterality: N/A;  . facial orbital reconstruction      Prior to Admission medications   Medication Sig Start Date End Date Taking? Authorizing Provider  Cholecalciferol (VITAMIN D) 50 MCG (2000 UT) tablet Take 2,000 Units by mouth daily.   Yes [provider]  escitalopram (LEXAPRO) 20 MG tablet Take 20 mg by mouth daily.    Yes [provider]  Glucosamine-Chondroit-Vit C-Mn (GLUCOSAMINE CHONDR 1500 COMPLX) CAPS Take 1 capsule by mouth as directed.   Yes [provider]  levothyroxine (SYNTHROID) 25 MCG tablet Take 25 mcg by mouth daily.    Yes [provider]  lisinopril (ZESTRIL) 5 MG tablet Take 5 mg by mouth daily.  11/01/18  Yes [provider]  LORazepam (ATIVAN) 0.5 MG tablet Take 0.5 mg by mouth 2 (two) times daily as needed for anxiety.    Yes [provider]  meloxicam (MOBIC) 15 MG tablet Take 15 mg by mouth daily.   Yes [provider]  montelukast (SINGULAIR) 10 MG tablet Take 10 mg by mouth at bedtime.    Yes [provider]  Multiple Vitamin (MULTI-VITAMIN) tablet Take 1 tablet by mouth daily.    Yes [provider]  rosuvastatin  (CRESTOR) 5 MG tablet Take 5 mg by mouth at bedtime.  11/19/18  Yes [provider]  TRAZODONE HCL PO Take by mouth.   Yes [provider]  chlorthalidone (HYGROTON) 25 MG tablet Take 25 mg by mouth daily.  Patient not taking: Reported on 11/22/2019 10/04/18   [provider]  traMADol (ULTRAM) 50 MG tablet Take 50 mg by mouth every 6 (six) hours as needed for moderate pain.  11/16/18 12/16/19  [provider]  hydrochlorothiazide (HYDRODIURIL) 25 MG tablet Take 25 mg by mouth daily.  11/20/18  [provider]  omeprazole (PRILOSEC) 40 MG capsule Take 40 mg by mouth daily.  11/20/18  [provider]  propranolol ER (INDERAL LA) 120 MG 24 hr capsule Take 120 mg by mouth daily.  11/20/18  [provider]    Allergies as of 11/22/2019 - Review Complete 11/22/2019  Allergen Reaction Noted  . Other Nausea And Vomiting 09/12/2013  . Atorvastatin Other (See Comments) 03/09/2018  . Metronidazole Nausea Only 09/29/2017  . Percocet [oxycodone-acetaminophen] Nausea And Vomiting 09/13/2014    Family History  Problem Relation Age of Onset  . Breast cancer Paternal Aunt   . Breast cancer Paternal Aunt   . Breast cancer Paternal Aunt   . Breast cancer Paternal Aunt     Social History   Socioeconomic History  . Marital status: Widowed    Spouse name: Not on file  .  Number of children: Not on file  . Years of education: Not on file  . Highest education level: Not on file  Occupational History  . Not on file  Tobacco Use  . Smoking status: Never Smoker  . Smokeless tobacco: Never Used  Vaping Use  . Vaping Use: Never used  Substance and Sexual Activity  . Alcohol use: Yes    Alcohol/week: 1.0 standard drink    Types: 1 Glasses of wine per week    Comment: none last 24hrs  . Drug use: Never  . Sexual activity: Yes  Other Topics Concern  . Not on file  Social History Narrative   Independent at baseline.  Lives at home with her husband    Social Determinants of Health   Financial Resource Strain:   . Difficulty of Paying Living Expenses: Not on file  Food Insecurity:   . Worried About Programme researcher, broadcasting/film/video in the Last Year: Not on file  . Ran Out of Food in the Last Year: Not on file  Transportation Needs:   . Lack of Transportation (Medical): Not on file  . Lack of Transportation (Non-Medical): Not on file  Physical Activity:   . Days of Exercise per Week: Not on file  . Minutes of Exercise per Session: Not on file  Stress:   . Feeling of Stress : Not on file  Social Connections:   . Frequency of Communication with Friends and Family: Not on file  . Frequency of Social Gatherings with Friends and Family: Not on file  . Attends Religious Services: Not on file  . Active Member of Clubs or Organizations: Not on file  . Attends Banker Meetings: Not on file  . Marital Status: Not on file  Intimate Partner Violence:   . Fear of Current or Ex-Partner: Not on file  . Emotionally Abused: Not on file  . Physically Abused: Not on file  . Sexually Abused: Not on file    Review of Systems: See HPI, otherwise negative ROS  Physical Exam: BP 132/76   Pulse 70   Temp (!) 96.7 F (35.9 C) (Temporal)   Resp 19   Ht 5\' 3"  (1.6 m)   Wt 109.8 kg   SpO2 96%   BMI 42.87 kg/m  General:   Alert,  pleasant and cooperative in NAD Head:  Normocephalic and atraumatic. Neck:  Supple; no masses or thyromegaly. Lungs:  Clear throughout to auscultation.    Heart:  Regular rate and rhythm. Abdomen:  Soft, nontender and nondistended. Normal bowel sounds, without guarding, and without rebound.   Neurologic:  Alert and  oriented x4;  grossly normal neurologically.  Impression/Plan: Laurie Pennington is here for an colonoscopy to be performed for colon cancer screening  Risks, benefits, limitations, and alternatives regarding  colonoscopy have been reviewed with the patient.  Questions have been answered.  All parties  agreeable.   Oley Balm, MD  12/13/2019, 9:26 AM

## 2019-12-14 ENCOUNTER — Encounter: Payer: Self-pay | Admitting: Gastroenterology

## 2020-08-10 ENCOUNTER — Encounter: Payer: Self-pay | Admitting: Gastroenterology

## 2020-08-10 ENCOUNTER — Ambulatory Visit: Payer: Medicare HMO | Admitting: Gastroenterology

## 2020-08-10 ENCOUNTER — Other Ambulatory Visit: Payer: Self-pay

## 2020-08-10 VITALS — BP 123/78 | HR 83 | Temp 97.8°F | Ht 62.0 in | Wt 236.2 lb

## 2020-08-10 DIAGNOSIS — K5904 Chronic idiopathic constipation: Secondary | ICD-10-CM | POA: Diagnosis not present

## 2020-08-10 NOTE — Patient Instructions (Signed)
High-Fiber Eating Plan Fiber, also called dietary fiber, is a type of carbohydrate. It is found foods such as fruits, vegetables, whole grains, and beans. A high-fiber diet can have many health benefits. Your health care provider may recommend a high-fiber diet to help:  Prevent constipation. Fiber can make your bowel movements more regular.  Lower your cholesterol.  Relieve the following conditions: ? Inflammation of veins in the anus (hemorrhoids). ? Inflammation of specific areas of the digestive tract (uncomplicated diverticulosis). ? A problem of the large intestine, also called the colon, that sometimes causes pain and diarrhea (irritable bowel syndrome, or IBS).  Prevent overeating as part of a weight-loss plan.  Prevent heart disease, type 2 diabetes, and certain cancers. What are tips for following this plan? Reading food labels  Check the nutrition facts label on food products for the amount of dietary fiber. Choose foods that have 5 grams of fiber or more per serving.  The goals for recommended daily fiber intake include: ? Men (age 50 or younger): 34-38 g. ? Men (over age 50): 28-34 g. ? Women (age 50 or younger): 25-28 g. ? Women (over age 50): 22-25 g. Your daily fiber goal is _____________ g.   Shopping  Choose whole fruits and vegetables instead of processed forms, such as apple juice or applesauce.  Choose a wide variety of high-fiber foods such as avocados, lentils, oats, and kidney beans.  Read the nutrition facts label of the foods you choose. Be aware of foods with added fiber. These foods often have high sugar and sodium amounts per serving. Cooking  Use whole-grain flour for baking and cooking.  Cook with brown rice instead of white rice. Meal planning  Start the day with a breakfast that is high in fiber, such as a cereal that contains 5 g of fiber or more per serving.  Eat breads and cereals that are made with whole-grain flour instead of refined  flour or white flour.  Eat brown rice, bulgur wheat, or millet instead of white rice.  Use beans in place of meat in soups, salads, and pasta dishes.  Be sure that half of the grains you eat each day are whole grains. General information  You can get the recommended daily intake of dietary fiber by: ? Eating a variety of fruits, vegetables, grains, nuts, and beans. ? Taking a fiber supplement if you are not able to take in enough fiber in your diet. It is better to get fiber through food than from a supplement.  Gradually increase how much fiber you consume. If you increase your intake of dietary fiber too quickly, you may have bloating, cramping, or gas.  Drink plenty of water to help you digest fiber.  Choose high-fiber snacks, such as berries, raw vegetables, nuts, and popcorn. What foods should I eat? Fruits Berries. Pears. Apples. Oranges. Avocado. Prunes and raisins. Dried figs. Vegetables Sweet potatoes. Spinach. Kale. Artichokes. Cabbage. Broccoli. Cauliflower. Green peas. Carrots. Squash. Grains Whole-grain breads. Multigrain cereal. Oats and oatmeal. Brown rice. Barley. Bulgur wheat. Millet. Quinoa. Bran muffins. Popcorn. Rye wafer crackers. Meats and other proteins Navy beans, kidney beans, and pinto beans. Soybeans. Split peas. Lentils. Nuts and seeds. Dairy Fiber-fortified yogurt. Beverages Fiber-fortified soy milk. Fiber-fortified orange juice. Other foods Fiber bars. The items listed above may not be a complete list of recommended foods and beverages. Contact a dietitian for more information. What foods should I avoid? Fruits Fruit juice. Cooked, strained fruit. Vegetables Fried potatoes. Canned vegetables. Well-cooked vegetables. Grains   White bread. Pasta made with refined flour. White rice. Meats and other proteins Fatty cuts of meat. Fried chicken or fried fish. Dairy Milk. Yogurt. Cream cheese. Sour cream. Fats and oils Butters. Beverages Soft  drinks. Other foods Cakes and pastries. The items listed above may not be a complete list of foods and beverages to avoid. Talk with your dietitian about what choices are best for you. Summary  Fiber is a type of carbohydrate. It is found in foods such as fruits, vegetables, whole grains, and beans.  A high-fiber diet has many benefits. It can help to prevent constipation, lower blood cholesterol, aid weight loss, and reduce your risk of heart disease, diabetes, and certain cancers.  Increase your intake of fiber gradually. Increasing fiber too quickly may cause cramping, bloating, and gas. Drink plenty of water while you increase the amount of fiber you consume.  The best sources of fiber include whole fruits and vegetables, whole grains, nuts, seeds, and beans. This information is not intended to replace advice given to you by your health care provider. Make sure you discuss any questions you have with your health care provider. Document Revised: 08/04/2019 Document Reviewed: 08/04/2019 Elsevier Patient Education  2021 [employer]  

## 2020-08-10 NOTE — Progress Notes (Signed)
Laurie Repress, MD [address]  [address]  [place], [place] [address]  Main: [phone]  Fax: [phone]    Gastroenterology Consultation  Referring Provider:     Lynwood Dawley, MD Primary Care Physician:  Laurie Dawley, MD Primary Gastroenterologist:  Dr. Arlyss Pennington Reason for Consultation:     Chronic constipation        HPI:   Laurie Pennington is a 70 y.o. female referred by Dr. Philis Pennington, Laurie Humble, MD  for consultation & management of chronic constipation.  Patient reports several years history of irregular bowel habits, hard lumpy stools, describes as pellets associated with abdominal bloating and left lower quadrant pain.  Patient is currently following weight watchers diet, lost about 15 pounds.  She has tried stool softeners in the past but not much success.  Currently taking MiraLAX whenever she remembers.  She does have bouts of diarrhea in between episodes of constipation associated with severe lower abdominal cramps.  She does acknowledge drinking diet soda, sweetened, consumes red meat regularly.  Her diet is devoid of fiber.  She states, she has been under stress since her husband passed away in 10-02-2022 last year.  She lives alone.  She had a vasovagal response end of March due to severe constipation.  NSAIDs: None  Antiplts/Anticoagulants/An none ti thrombotics: None   GI Procedures: Colonoscopy 12/13/2019 - Diverticulosis in the recto-sigmoid colon and in the sigmoid colon. - Non-bleeding external hemorrhoids. - The entire examined colon is normal. - No specimens collected.  Colonoscopy 10/13/2014 - Diverticulosis in the sigmoid colon and in the descending colon. - Tortuous colon. - External and internal hemorrhoids. - Biopsies were taken with a cold forceps from the right colon and left colon for evaluation of microscopic colitis. DIAGNOSIS:  A. COLON, RIGHT; COLD BIOPSY:  - UNREMARKABLE COLONIC MUCOSA.   B. COLON, LEFT; COLD BIOPSY:  -  UNREMARKABLE COLONIC MUCOSA.   Past Medical History:  Diagnosis Date  . Anxiety   . Depression   . Hyperlipidemia   . Hypertension   . IBS (irritable bowel syndrome)   . Osteoarthritis     Past Surgical History:  Procedure Laterality Date  . ABDOMINAL HYSTERECTOMY    . BREAST EXCISIONAL BIOPSY Left 1991   neg  . COLONOSCOPY N/A 10/13/2014   Procedure: COLONOSCOPY;  Surgeon: Laurie Deem, MD;  Location: [facility] ENDOSCOPY;  Service: Endoscopy;  Laterality: N/A;  . COLONOSCOPY WITH PROPOFOL N/A 12/13/2019   Procedure: COLONOSCOPY WITH PROPOFOL;  Surgeon: Laurie Reil, MD;  Location: [facility] ENDOSCOPY;  Service: Gastroenterology;  Laterality: N/A;  . facial orbital reconstruction      Current Outpatient Medications:  .  Cholecalciferol (VITAMIN D) 50 MCG (2000 UT) tablet, Take 2,000 Units by mouth daily., Disp: , Rfl:  .  escitalopram (LEXAPRO) 20 MG tablet, Take 20 mg by mouth daily. , Disp: , Rfl:  .  Glucosamine-Chondroit-Vit C-Mn (GLUCOSAMINE CHONDR 1500 COMPLX) CAPS, Take 1 capsule by mouth as directed., Disp: , Rfl:  .  levothyroxine (SYNTHROID) 25 MCG tablet, Take 25 mcg by mouth daily. , Disp: , Rfl:  .  lisinopril (ZESTRIL) 5 MG tablet, Take 5 mg by mouth daily. , Disp: , Rfl:  .  LORazepam (ATIVAN) 0.5 MG tablet, Take 0.5 mg by mouth 2 (two) times daily as needed for anxiety. , Disp: , Rfl:  .  meloxicam (MOBIC) 15 MG tablet, Take 15 mg by mouth daily., Disp: , Rfl:  .  montelukast (SINGULAIR) 10  MG tablet, Take 10 mg by mouth at bedtime. , Disp: , Rfl:  .  Multiple Vitamin (MULTI-VITAMIN) tablet, Take 1 tablet by mouth daily. , Disp: , Rfl:  .  rosuvastatin (CRESTOR) 5 MG tablet, Take 5 mg by mouth at bedtime. , Disp: , Rfl:  .  TRAZODONE HCL PO, Take by mouth., Disp: , Rfl:    Family History  Problem Relation Age of Onset  . Breast cancer Paternal Aunt   . Breast cancer Paternal Aunt   . Breast cancer Paternal Aunt   . Breast cancer Paternal Aunt      Social  History   Tobacco Use  . Smoking status: Never Smoker  . Smokeless tobacco: Never Used  Vaping Use  . Vaping Use: Never used  Substance Use Topics  . Alcohol use: Yes    Alcohol/week: 1.0 standard drink    Types: 1 Glasses of wine per week    Comment: none last 24hrs  . Drug use: Never    Allergies as of 08/10/2020 - Review Complete 08/10/2020  Allergen Reaction Noted  . Other Nausea And Vomiting 09/12/2013  . Atorvastatin Other (See Comments) 03/09/2018  . Metronidazole Nausea Only 09/29/2017  . Percocet [oxycodone-acetaminophen] Nausea And Vomiting 09/13/2014    Review of Systems:    All systems reviewed and negative except where noted in HPI.   Physical Exam:  BP 123/78 (BP Location: Left Arm, Patient Position: Sitting, Cuff Size: Large)   Pulse 83   Temp 97.8 F (36.6 C) (Oral)   Ht 5\' 2"  (1.575 m)   Wt 236 lb 4 oz (107.2 kg)   BMI 43.21 kg/m  No LMP recorded. Patient has had a hysterectomy.  General:   Alert,  Well-developed, well-nourished, pleasant and cooperative in NAD Head:  Normocephalic and atraumatic. Eyes:  Sclera clear, no icterus.   Conjunctiva pink. Ears:  Normal auditory acuity. Nose:  No deformity, discharge, or lesions. Mouth:  No deformity or lesions,oropharynx pink & moist. Neck:  Supple; no masses or thyromegaly. Lungs:  Respirations even and unlabored.  Clear throughout to auscultation.   No wheezes, crackles, or rhonchi. No acute distress. Heart:  Regular rate and rhythm; no murmurs, clicks, rubs, or gallops. Abdomen:  Normal bowel sounds. Soft, non-tender, obese and non-distended without masses, hepatosplenomegaly or hernias noted.  No guarding or rebound tenderness.   Rectal: Not performed Msk:  Symmetrical without gross deformities. Good, equal movement & strength bilaterally. Pulses:  Normal pulses noted. Extremities:  No clubbing or edema.  No cyanosis. Neurologic:  Alert and oriented x3;  grossly normal neurologically. Skin:  Intact  without significant lesions or rashes. No jaundice. Psych:  Alert and cooperative. Normal mood and affect.  Imaging Studies: Reviewed  Assessment and Plan:   Laurie Pennington is a 70 y.o. female with obesity, BMI 43, known history of sigmoid diverticula seen in consultation for chronic constipation associated with left lower quadrant pain and abdominal bloating.  Etiology most likely secondary to dietary habits and sedentary lifestyle, her diet is devoid of fiber Recommend high-fiber diet, information provided Strongly advised her to cut back on carbonated beverages, juices and sugary drinks Adequate intake of water Trial of Linzess 62 daily, samples provided  Follow up in 3 months   , MD

## 2020-11-29 ENCOUNTER — Ambulatory Visit: Payer: Medicare HMO | Admitting: Gastroenterology

## 2020-12-18 ENCOUNTER — Other Ambulatory Visit: Payer: Self-pay | Admitting: Student

## 2020-12-18 DIAGNOSIS — Z1231 Encounter for screening mammogram for malignant neoplasm of breast: Secondary | ICD-10-CM

## 2021-01-03 ENCOUNTER — Other Ambulatory Visit: Payer: Self-pay

## 2021-01-03 ENCOUNTER — Ambulatory Visit
Admission: RE | Admit: 2021-01-03 | Discharge: 2021-01-03 | Disposition: A | Discharge status: Home / Self Care | Payer: Medicare HMO | Source: Ambulatory Visit | Attending: Student | Admitting: Student

## 2021-01-03 DIAGNOSIS — Z1231 Encounter for screening mammogram for malignant neoplasm of breast: Secondary | ICD-10-CM | POA: Insufficient documentation

## 2021-01-10 ENCOUNTER — Other Ambulatory Visit: Payer: Self-pay | Admitting: Student

## 2021-01-14 ENCOUNTER — Other Ambulatory Visit: Payer: Self-pay | Admitting: Student

## 2021-01-14 DIAGNOSIS — N6489 Other specified disorders of breast: Secondary | ICD-10-CM

## 2021-01-14 DIAGNOSIS — R928 Other abnormal and inconclusive findings on diagnostic imaging of breast: Secondary | ICD-10-CM

## 2021-01-18 ENCOUNTER — Ambulatory Visit
Admission: RE | Admit: 2021-01-18 | Discharge: 2021-01-18 | Disposition: A | Discharge status: Home / Self Care | Payer: Medicare HMO | Source: Ambulatory Visit | Attending: Student | Admitting: Student

## 2021-01-18 ENCOUNTER — Other Ambulatory Visit: Payer: Self-pay

## 2021-01-18 DIAGNOSIS — R928 Other abnormal and inconclusive findings on diagnostic imaging of breast: Secondary | ICD-10-CM | POA: Diagnosis present

## 2021-01-18 DIAGNOSIS — N6489 Other specified disorders of breast: Secondary | ICD-10-CM | POA: Insufficient documentation

## 2021-01-21 ENCOUNTER — Ambulatory Visit: Payer: Medicare HMO | Admitting: Gastroenterology

## 2021-02-21 ENCOUNTER — Ambulatory Visit (INDEPENDENT_AMBULATORY_CARE_PROVIDER_SITE_OTHER): Payer: Medicare HMO | Admitting: Gastroenterology

## 2021-02-21 ENCOUNTER — Encounter: Payer: Self-pay | Admitting: Gastroenterology

## 2021-02-21 VITALS — BP 164/91 | HR 71 | Temp 98.6°F | Ht 62.0 in | Wt 238.4 lb

## 2021-02-21 DIAGNOSIS — R195 Other fecal abnormalities: Secondary | ICD-10-CM | POA: Diagnosis not present

## 2021-02-21 DIAGNOSIS — K5904 Chronic idiopathic constipation: Secondary | ICD-10-CM

## 2021-02-21 NOTE — Progress Notes (Signed)
Cephas Darby, MD [address]  [place]  [place], [place] [address]  Main: [phone]  Fax: [phone]    Gastroenterology Consultation  Referring Provider:     Gustavo Lah, MD Primary Care Physician:  Gustavo Lah, MD Primary Gastroenterologist:  Dr. Cephas Darby Reason for Consultation:     Chronic constipation        HPI:   Laurie Pennington is a 70 y.o. female referred by Dr. Arlington Calix, Fanny Dance, MD  for consultation & management of chronic constipation.  Patient reports several years history of irregular bowel habits, hard lumpy stools, describes as pellets associated with abdominal bloating and left lower quadrant pain.  Patient is currently following weight watchers diet, lost about 15 pounds.  She has tried stool softeners in the past but not much success.  Currently taking MiraLAX whenever she remembers.  She does have bouts of diarrhea in between episodes of constipation associated with severe lower abdominal cramps.  She does acknowledge drinking diet soda, sweetened, consumes red meat regularly.  Her diet is devoid of fiber.  She states, she has been under stress since her husband passed away in 2022/10/19 last year.  She lives alone.  She had a vasovagal response end of March due to severe constipation.  Follow-up visit 02/21/2021 Tried Linzess 145 MCG samples which resulted in severe diarrhea.  She reports that she tried Amitiza 82mcg twice daily several years ago which helped with constipation but resulted in loose stools.  She said once a day dosing helped.  She went to the beach this past weekend with her cousin, she said she did fine at the beach but after coming home, she developed severe constipation.  She is also experiencing alternating episodes of diarrhea since her husband passed away.  She became tearful today and she says she misses her husband very much.  NSAIDs: None  Antiplts/Anticoagulants/An none ti thrombotics: None  GI Procedures:  Colonoscopy 12/13/2019 - Diverticulosis in the recto-sigmoid colon and in the sigmoid colon. - Non-bleeding external hemorrhoids. - The entire examined colon is normal. - No specimens collected.  Colonoscopy 10/13/2014 - Diverticulosis in the sigmoid colon and in the descending colon. - Tortuous colon. - External and internal hemorrhoids. - Biopsies were taken with a cold forceps from the right colon and left colon for evaluation of microscopic colitis. DIAGNOSIS:  A. COLON, RIGHT; COLD BIOPSY:  - UNREMARKABLE COLONIC MUCOSA.   B. COLON, LEFT; COLD BIOPSY:  - UNREMARKABLE COLONIC MUCOSA.   Past Medical History:  Diagnosis Date   Anxiety    Depression    Hyperlipidemia    Hypertension    IBS (irritable bowel syndrome)    Osteoarthritis     Past Surgical History:  Procedure Laterality Date   ABDOMINAL HYSTERECTOMY     BREAST EXCISIONAL BIOPSY Left 1991   neg   COLONOSCOPY N/A 10/13/2014   Procedure: COLONOSCOPY;  Surgeon: Lollie Sails, MD;  Location: [facility] ENDOSCOPY;  Service: Endoscopy;  Laterality: N/A;   COLONOSCOPY WITH PROPOFOL N/A 12/13/2019   Procedure: COLONOSCOPY WITH PROPOFOL;  Surgeon: Lin Landsman, MD;  Location: [facility] ENDOSCOPY;  Service: Gastroenterology;  Laterality: N/A;   facial orbital reconstruction      Current Outpatient Medications:    Cholecalciferol (VITAMIN D) 50 MCG (2000 UT) tablet, Take 2,000 Units by mouth daily., Disp: , Rfl:    escitalopram (LEXAPRO) 20 MG tablet, Take 20 mg by mouth daily. , Disp: , Rfl:    Glucosamine-Chondroit-Vit  C-Mn (GLUCOSAMINE CHONDR 1500 COMPLX) CAPS, Take 1 capsule by mouth as directed., Disp: , Rfl:    levothyroxine (SYNTHROID) 25 MCG tablet, Take 25 mcg by mouth daily. , Disp: , Rfl:    lisinopril (ZESTRIL) 5 MG tablet, Take 5 mg by mouth daily. , Disp: , Rfl:    meloxicam (MOBIC) 15 MG tablet, Take 15 mg by mouth daily., Disp: , Rfl:    montelukast (SINGULAIR) 10 MG tablet, Take 10 mg by mouth at bedtime. ,  Disp: , Rfl:    Multiple Vitamin (MULTI-VITAMIN) tablet, Take 1 tablet by mouth daily. , Disp: , Rfl:    rosuvastatin (CRESTOR) 5 MG tablet, Take 5 mg by mouth at bedtime. , Disp: , Rfl:    TRAZODONE HCL PO, Take by mouth., Disp: , Rfl:    LORazepam (ATIVAN) 1 MG tablet, Take 1 mg by mouth 2 (two) times daily as needed., Disp: , Rfl:    Family History  Problem Relation Age of Onset   Breast cancer Paternal Aunt    Breast cancer Paternal Aunt    Breast cancer Paternal Aunt    Breast cancer Paternal Aunt      Social History   Tobacco Use   Smoking status: Never   Smokeless tobacco: Never  Vaping Use   Vaping Use: Never used  Substance Use Topics   Alcohol use: Yes    Alcohol/week: 1.0 standard drink    Types: 1 Glasses of wine per week    Comment: none last 24hrs   Drug use: Never    Allergies as of 02/21/2021 - Review Complete 02/21/2021  Allergen Reaction Noted   Other Nausea And Vomiting 09/12/2013   Atorvastatin Other (See Comments) 03/09/2018   Metronidazole Nausea Only 09/29/2017   Percocet [oxycodone-acetaminophen] Nausea And Vomiting 09/13/2014    Review of Systems:    All systems reviewed and negative except where noted in HPI.   Physical Exam:  BP (!) 164/91 (BP Location: Right Arm, Patient Position: Sitting, Cuff Size: Large)   Pulse 71   Temp 98.6 F (37 C) (Oral)   Ht 5\' 2"  (1.575 m)   Wt 238 lb 6.4 oz (108.1 kg)   BMI 43.60 kg/m  No LMP recorded. Patient has had a hysterectomy.  General:   Alert,  Well-developed, well-nourished, pleasant and cooperative in NAD Head:  Normocephalic and atraumatic. Eyes:  Sclera clear, no icterus.   Conjunctiva pink. Ears:  Normal auditory acuity. Nose:  No deformity, discharge, or lesions. Mouth:  No deformity or lesions,oropharynx pink & moist. Neck:  Supple; no masses or thyromegaly. Lungs:  Respirations even and unlabored.  Clear throughout to auscultation.   No wheezes, crackles, or rhonchi. No acute  distress. Heart:  Regular rate and rhythm; no murmurs, clicks, rubs, or gallops. Abdomen:  Normal bowel sounds. Soft, non-tender, obese and non-distended without masses, hepatosplenomegaly or hernias noted.  No guarding or rebound tenderness.   Rectal: Not performed Msk:  Symmetrical without gross deformities. Good, equal movement & strength bilaterally. Pulses:  Normal pulses noted. Extremities:  No clubbing or edema.  No cyanosis. Neurologic:  Alert and oriented x3;  grossly normal neurologically. Skin:  Intact without significant lesions or rashes. No jaundice. Psych:  Alert and cooperative. Normal mood and affect.  Imaging Studies: Reviewed  Assessment and Plan:   Mairely Foxworth is a 70 y.o. female with obesity, BMI 43, known history of sigmoid diverticula seen in consultation for chronic constipation associated with left lower quadrant pain and abdominal bloating.  Etiology most likely secondary to dietary habits and sedentary lifestyle, her diet is devoid of fiber.  Also, emotional distress since her husband passed away Continue high-fiber diet, information provided Strongly advised her to cut back on carbonated beverages, juices and sugary drinks Adequate intake of water Linzess 145MCG resulted in severe diarrhea Will switch to Amitiza 8 MCG 1-2 times daily  Patient's father died from pancreatic cancer at age 56, he was a smoker I discussed with patient about the CT scan, she would like to defer at this time Check pancreatic fecal elastase levels  Follow up in 6 months   Cephas Darby, MD

## 2021-02-25 ENCOUNTER — Other Ambulatory Visit: Payer: Self-pay | Admitting: Gastroenterology

## 2021-02-25 ENCOUNTER — Encounter: Payer: Self-pay | Admitting: Gastroenterology

## 2021-02-25 DIAGNOSIS — K5904 Chronic idiopathic constipation: Secondary | ICD-10-CM

## 2021-02-25 MED ORDER — LUBIPROSTONE 8 MCG PO CAPS: 8.0000 ug | ORAL_CAPSULE | Freq: Two times a day (BID) | ORAL | 3 refills | Status: AC

## 2021-12-06 ENCOUNTER — Other Ambulatory Visit: Payer: Self-pay | Admitting: Student

## 2021-12-06 DIAGNOSIS — Z1231 Encounter for screening mammogram for malignant neoplasm of breast: Secondary | ICD-10-CM

## 2021-12-13 DIAGNOSIS — R7303 Prediabetes: Secondary | ICD-10-CM | POA: Insufficient documentation

## 2022-01-28 ENCOUNTER — Ambulatory Visit
Admission: RE | Admit: 2022-01-28 | Discharge: 2022-01-28 | Disposition: A | Discharge status: Home / Self Care | Payer: Medicare HMO | Source: Ambulatory Visit | Attending: Student | Admitting: Student

## 2022-01-28 DIAGNOSIS — Z1231 Encounter for screening mammogram for malignant neoplasm of breast: Secondary | ICD-10-CM | POA: Insufficient documentation

## 2022-02-17 DIAGNOSIS — N3946 Mixed incontinence: Secondary | ICD-10-CM | POA: Insufficient documentation

## 2022-02-17 DIAGNOSIS — N8111 Cystocele, midline: Secondary | ICD-10-CM | POA: Insufficient documentation

## 2022-02-19 DIAGNOSIS — N993 Prolapse of vaginal vault after hysterectomy: Secondary | ICD-10-CM | POA: Insufficient documentation

## 2022-03-04 ENCOUNTER — Other Ambulatory Visit: Payer: Self-pay | Admitting: Student

## 2022-03-04 DIAGNOSIS — Z780 Asymptomatic menopausal state: Secondary | ICD-10-CM

## 2022-04-29 ENCOUNTER — Ambulatory Visit: Payer: Medicare HMO | Admitting: Obstetrics and Gynecology

## 2022-05-02 ENCOUNTER — Telehealth: Payer: Self-pay | Admitting: Gastroenterology

## 2022-05-02 NOTE — Telephone Encounter (Signed)
Called and left a detail message explaining to patient I do not have any earlier appointments but I have patient on wait list and if anything comes up sooner we will call her

## 2022-05-02 NOTE — Telephone Encounter (Signed)
Patient is wondering if she can get a sooner appointment. She states she is going to out of [place] and is wondering she can be seen before then.

## 2022-06-02 ENCOUNTER — Encounter: Payer: Self-pay | Admitting: *Deleted

## 2022-06-05 ENCOUNTER — Other Ambulatory Visit: Payer: Self-pay

## 2022-06-10 ENCOUNTER — Encounter: Payer: Self-pay | Admitting: Gastroenterology

## 2022-06-10 ENCOUNTER — Ambulatory Visit: Payer: Medicare HMO | Admitting: Gastroenterology

## 2022-06-10 DIAGNOSIS — K5904 Chronic idiopathic constipation: Secondary | ICD-10-CM

## 2022-06-10 MED ORDER — LUBIPROSTONE 8 MCG PO CAPS: 8.0000 ug | ORAL_CAPSULE | Freq: Two times a day (BID) | ORAL | 3 refills | Status: DC

## 2022-06-10 NOTE — Patient Instructions (Signed)

## 2022-06-10 NOTE — Progress Notes (Signed)
Cephas Darby, MD [address]  [place]  [place], [place] [address]  Main: [phone]  Fax: [phone]    Gastroenterology Consultation  Referring Provider:     Gustavo Lah, MD Primary Care Physician:  Gustavo Lah, MD Primary Gastroenterologist:  Dr. Cephas Darby Reason for Consultation:     Chronic constipation        HPI:   Laurie Pennington is a 72 y.o. female referred by Dr. [place] Calix, Fanny Dance, MD  for consultation & management of chronic constipation.  Patient reports several years history of irregular bowel habits, hard lumpy stools, describes as pellets associated with abdominal bloating and left lower quadrant pain.  Patient is currently following weight watchers diet, lost about 15 pounds.  She has tried stool softeners in the past but not much success.  Currently taking MiraLAX whenever she remembers.  She does have bouts of diarrhea in between episodes of constipation associated with severe lower abdominal cramps.  She does acknowledge drinking diet soda, sweetened, consumes red meat regularly.  Her diet is devoid of fiber.  She states, she has been under stress since her husband passed away in 10/05/2022 last year.  She lives alone.  She had a vasovagal response end of March due to severe constipation.  Follow-up visit 02/21/2021 Tried Linzess 145 MCG samples which resulted in severe diarrhea.  She reports that she tried Amitiza 58mg twice daily several years ago which helped with constipation but resulted in loose stools.  She said once a day dosing helped.  She went to the beach this past weekend with her cousin, she said she did fine at the beach but after coming home, she developed severe constipation.  She is also experiencing alternating episodes of diarrhea since her husband passed away.  She became tearful today and she says she misses her husband very much.  Follow-up visit 06/10/2022 Patient is here for follow-up of severe constipation.   Patient has remarried, 10 months ago.  She has been stressed out lately about her son who has several health issues.  She reports having episodes of dizzy spells with nausea, abdominal pain and bowel movement.  Her bowel movements are hard, like rabbit pellets, not every day, has been taking Metamucil.  [facility] does [facility], consumes red meat to 2-3 times a week, cheese products regularly, has less amount of fiber.  Planning to go to [place] with her husband.  She is on antianxiety medication for anxiety attacks managed by her PCP.  She denies any rectal bleeding or abdominal bloating.  She has not tried Amitiza as we discussed before.  NSAIDs: None  Antiplts/Anticoagulants/An none ti thrombotics: None  GI Procedures: Colonoscopy 12/13/2019 - Diverticulosis in the recto-sigmoid colon and in the sigmoid colon. - Non-bleeding external hemorrhoids. - The entire examined colon is normal. - No specimens collected.  Colonoscopy 10/13/2014 - Diverticulosis in the sigmoid colon and in the descending colon. - Tortuous colon. - External and internal hemorrhoids. - Biopsies were taken with a cold forceps from the right colon and left colon for evaluation of microscopic colitis. DIAGNOSIS:  A. COLON, RIGHT; COLD BIOPSY:  - UNREMARKABLE COLONIC MUCOSA.   B. COLON, LEFT; COLD BIOPSY:  - UNREMARKABLE COLONIC MUCOSA.   Past Medical History:  Diagnosis Date   Anxiety    Depression    Hyperlipidemia    Hypertension    IBS (irritable bowel syndrome)    Osteoarthritis  Past Surgical History:  Procedure Laterality Date   ABDOMINAL HYSTERECTOMY     BREAST EXCISIONAL BIOPSY Left 1991   neg   COLONOSCOPY N/A 10/13/2014   Procedure: COLONOSCOPY;  Surgeon: Lollie Sails, MD;  Location: [facility] ENDOSCOPY;  Service: Endoscopy;  Laterality: N/A;   COLONOSCOPY WITH PROPOFOL N/A 12/13/2019   Procedure: COLONOSCOPY WITH PROPOFOL;  Surgeon: Lin Landsman, MD;   Location: [facility] ENDOSCOPY;  Service: Gastroenterology;  Laterality: N/A;   facial orbital reconstruction      Current Outpatient Medications:    Acetaminophen 500 MG capsule, Take by mouth., Disp: , Rfl:    Cholecalciferol (VITAMIN D) 50 MCG (2000 UT) tablet, Take 2,000 Units by mouth daily., Disp: , Rfl:    cyanocobalamin (VITAMIN B12) 1000 MCG tablet, Take by mouth., Disp: , Rfl:    desonide (DESOWEN) 0.05 % cream, Apply topically., Disp: , Rfl:    escitalopram (LEXAPRO) 20 MG tablet, Take 20 mg by mouth daily. , Disp: , Rfl:    estradiol (ESTRACE) 0.1 MG/GM vaginal cream, Place vaginally., Disp: , Rfl:    Glucosamine-Chondroit-Vit C-Mn (GLUCOSAMINE CHONDR 1500 COMPLX) CAPS, Take 1 capsule by mouth as directed., Disp: , Rfl:    ibuprofen (ADVIL) 600 MG tablet, Take by mouth., Disp: , Rfl:    levothyroxine (SYNTHROID) 25 MCG tablet, Take 25 mcg by mouth daily. , Disp: , Rfl:    lisinopril (ZESTRIL) 10 MG tablet, Take 10 mg by mouth daily., Disp: , Rfl:    LORazepam (ATIVAN) 1 MG tablet, Take 1 mg by mouth 2 (two) times daily as needed., Disp: , Rfl:    lubiprostone (AMITIZA) 8 MCG capsule, Take 1 capsule (8 mcg total) by mouth 2 (two) times daily with a meal., Disp: 60 capsule, Rfl: 3   meloxicam (MOBIC) 15 MG tablet, Take 15 mg by mouth daily., Disp: , Rfl:    montelukast (SINGULAIR) 10 MG tablet, Take 10 mg by mouth at bedtime. , Disp: , Rfl:    Multiple Vitamin (MULTI-VITAMIN) tablet, Take 1 tablet by mouth daily. , Disp: , Rfl:    polyethylene glycol powder (GLYCOLAX/MIRALAX) 17 GM/SCOOP powder, Take by mouth., Disp: , Rfl:    traMADol (ULTRAM) 50 MG tablet, Take 50 mg by mouth every 6 (six) hours as needed., Disp: , Rfl:    traZODone (DESYREL) 50 MG tablet, Take 50 mg by mouth at bedtime as needed., Disp: , Rfl:    Family History  Problem Relation Age of Onset   Breast cancer Paternal Aunt    Breast cancer Paternal Aunt    Breast cancer Paternal Aunt    Breast cancer Paternal Aunt       Social History   Tobacco Use   Smoking status: Never   Smokeless tobacco: Never  Vaping Use   Vaping Use: Never used  Substance Use Topics   Alcohol use: Yes    Alcohol/week: 1.0 standard drink of alcohol    Types: 1 Glasses of wine per week    Comment: none last 24hrs   Drug use: Never    Allergies as of 06/10/2022 - Review Complete 06/10/2022  Allergen Reaction Noted   Other Nausea And Vomiting 09/12/2013   Atorvastatin Other (See Comments) 03/09/2018   Metronidazole Nausea Only 09/29/2017   Percocet [oxycodone-acetaminophen] Nausea And Vomiting 09/13/2014    Review of Systems:    All systems reviewed and negative except where noted in HPI.   Physical Exam:  BP 118/82 (BP Location: Left Arm, Patient Position: Sitting, Cuff Size:  Large)   Pulse 71   Temp 97.8 F (36.6 C) (Oral)   Ht '5\' 2"'$  (1.575 m)   Wt 222 lb 4 oz (100.8 kg)   BMI 40.65 kg/m  No LMP recorded. Patient has had a hysterectomy.  General:   Alert,  Well-developed, well-nourished, pleasant and cooperative in NAD Head:  Normocephalic and atraumatic. Eyes:  Sclera clear, no icterus.   Conjunctiva pink. Ears:  Normal auditory acuity. Nose:  No deformity, discharge, or lesions. Mouth:  No deformity or lesions,oropharynx pink & moist. Neck:  Supple; no masses or thyromegaly. Lungs:  Respirations even and unlabored.  Clear throughout to auscultation.   No wheezes, crackles, or rhonchi. No acute distress. Heart:  Regular rate and rhythm; no murmurs, clicks, rubs, or gallops. Abdomen:  Normal bowel sounds. Soft, non-tender, obese and non-distended without masses, hepatosplenomegaly or hernias noted.  No guarding or rebound tenderness.   Rectal: Not performed Msk:  Symmetrical without gross deformities. Good, equal movement & strength bilaterally. Pulses:  Normal pulses noted. Extremities:  No clubbing or edema.  No cyanosis. Neurologic:  Alert and oriented x3;  grossly normal neurologically. Skin:   Intact without significant lesions or rashes. No jaundice. Psych:  Alert and cooperative. Normal mood and affect.  Imaging Studies: Reviewed  Assessment and Plan:   Laurie Pennington is a 72 y.o. female with obesity, BMI 43, known history of sigmoid diverticula seen in consultation for chronic constipation associated with left lower quadrant pain and abdominal bloating.  Etiology most likely secondary to dietary habits and sedentary lifestyle, her diet is devoid of fiber.  Also, emotional distress regarding her son with health problems. Continue high-fiber diet, information provided Strongly advised her to cut back on carbonated beverages, juices and sugary drinks, cheese products, red meat Adequate intake of water Linzess 145MCG resulted in severe diarrhea Will switch to Amitiza 8 MCG 1-2 times daily   Follow up as needed   Cephas Darby, MD

## 2022-06-11 ENCOUNTER — Telehealth: Payer: Self-pay

## 2022-06-11 NOTE — Telephone Encounter (Signed)
Approved today Your request has been approved Authorization Expiration Date: 04/14/2023

## 2022-06-11 NOTE — Telephone Encounter (Signed)
Submitted PA through cover my meds for the Amitiza 45mg. Waiting on response from insurance company

## 2022-08-05 ENCOUNTER — Ambulatory Visit: Payer: Medicare HMO | Admitting: Gastroenterology

## 2022-08-12 ENCOUNTER — Ambulatory Visit: Payer: Medicare HMO | Admitting: Gastroenterology

## 2022-12-30 ENCOUNTER — Other Ambulatory Visit: Payer: Self-pay | Admitting: Student

## 2022-12-30 DIAGNOSIS — Z1231 Encounter for screening mammogram for malignant neoplasm of breast: Secondary | ICD-10-CM

## 2023-02-03 ENCOUNTER — Ambulatory Visit
Admission: RE | Admit: 2023-02-03 | Discharge: 2023-02-03 | Disposition: A | Discharge status: Home / Self Care | Payer: Medicare HMO | Source: Ambulatory Visit | Attending: Student | Admitting: Student

## 2023-02-03 DIAGNOSIS — Z780 Asymptomatic menopausal state: Secondary | ICD-10-CM | POA: Diagnosis present

## 2023-02-03 DIAGNOSIS — Z1231 Encounter for screening mammogram for malignant neoplasm of breast: Secondary | ICD-10-CM | POA: Insufficient documentation

## 2023-09-02 ENCOUNTER — Telehealth: Payer: Self-pay

## 2023-09-02 ENCOUNTER — Telehealth: Payer: Self-pay | Admitting: Gastroenterology

## 2023-09-02 ENCOUNTER — Ambulatory Visit: Admitting: Gastroenterology

## 2023-09-02 VITALS — BP 141/84 | HR 69 | Temp 97.7°F | Wt 222.0 lb

## 2023-09-02 DIAGNOSIS — K5909 Other constipation: Secondary | ICD-10-CM

## 2023-09-02 DIAGNOSIS — K581 Irritable bowel syndrome with constipation: Secondary | ICD-10-CM

## 2023-09-02 DIAGNOSIS — R1032 Left lower quadrant pain: Secondary | ICD-10-CM | POA: Diagnosis not present

## 2023-09-02 MED ORDER — LUBIPROSTONE 24 MCG PO CAPS: 24.0000 ug | ORAL_CAPSULE | Freq: Two times a day (BID) | ORAL | 3 refills | Status: AC

## 2023-09-02 NOTE — Progress Notes (Signed)
 Karma Oz, MD [address]  [address]  [place], [place] [address]  Main: [phone]  Fax: [phone]    Gastroenterology Consultation  Referring Provider:     Damian Duke, MD Primary Care Physician:  Damian Duke, MD Primary Gastroenterologist:  Dr. Karma Oz Reason for Consultation:     Chronic constipation        HPI:   Laurie Pennington is a 73 y.o. female referred by Dr. Clementine Cutting, Jina Mound, MD  for consultation & management of chronic constipation.  Patient reports several years history of irregular bowel habits, hard lumpy stools, describes as pellets associated with abdominal bloating and left lower quadrant pain.  Patient is currently following weight watchers diet, lost about 15 pounds.  She has tried stool softeners in the past but not much success.  Currently taking MiraLAX whenever she remembers.  She does have bouts of diarrhea in between episodes of constipation associated with severe lower abdominal cramps.  She does acknowledge drinking diet soda, sweetened, consumes red meat regularly.  Her diet is devoid of fiber.  She states, she has been under stress since her husband passed away in 2023-09-28 last year.  She lives alone.  She had a vasovagal response end of March due to severe constipation.  Follow-up visit 02/21/2021 Tried Linzess 145 MCG samples which resulted in severe diarrhea.  She reports that she tried Amitiza  8mcg twice daily several years ago which helped with constipation but resulted in loose stools.  She said once a day dosing helped.  She went to the beach this past weekend with her cousin, she said she did fine at the beach but after coming home, she developed severe constipation.  She is also experiencing alternating episodes of diarrhea since her husband passed away.  She became tearful today and she says she misses her husband very much.  Follow-up visit 06/10/2022 Patient is here for follow-up of severe constipation.   Patient has remarried, 10 months ago.  She has been stressed out lately about her son who has several health issues.  She reports having episodes of dizzy spells with nausea, abdominal pain and bowel movement.  Her bowel movements are hard, like rabbit pellets, not every day, has been taking Metamucil.  She does acknowledge drinking [facility] daily, consumes red meat to 2-3 times a week, cheese products regularly, has less amount of fiber.  Planning to go to [place] in April with her husband.  She is on antianxiety medication for anxiety attacks managed by her PCP.  She denies any rectal bleeding or abdominal bloating.  She has not tried Amitiza  as we discussed before.  Follow-up visit 09/02/2023 Laurie Pennington called our office this morning to be urgently seen because of worsening of her IBS symptoms.  She is accompanied by her husband today.  She actually wrote down several notes and she did with me today.  She has known history of severe constipation episodes inducing vasovagal attacks.  She could not do Amitiza  in the past.  She reports that she has been having these attacks again since November 2027 and she was going through significant personal stress in her life.  She had episodes in March, April as well as May after she had fried zucchini and empty stomach as well as pills on empty stomach.  Most recent incident was when she was driving from [place] recently, she stopped on the way because she was feeling dizzy while standing and walking, a piece  of her manage as well as sugar powdered mini donuts for breakfast followed by another player with abdominal pain and urge to have a bowel movement, almost passed out resulting in vomiting.  She was having solid bowel movement followed by diarrhea and she vomited 4 times again.  She continued to have intermittent diarrhea since then for which she took Imodium  and Pepto-Bismol.  Patient clearly admits to not eating healthy at all, she does drink coffee with creamer,  carbonated beverages, bread, sweets.  Does not follow healthy lifestyle at all.  Does report abdominal bloating as well.  NSAIDs: None  Antiplts/Anticoagulants/An none ti thrombotics: None  GI Procedures: Colonoscopy 12/13/2019 - Diverticulosis in the recto-sigmoid colon and in the sigmoid colon. - Non-bleeding external hemorrhoids. - The entire examined colon is normal. - No specimens collected.  Colonoscopy 10/13/2014 - Diverticulosis in the sigmoid colon and in the descending colon. - Tortuous colon. - External and internal hemorrhoids. - Biopsies were taken with a cold forceps from the right colon and left colon for evaluation of microscopic colitis. DIAGNOSIS:  A. COLON, RIGHT; COLD BIOPSY:  - UNREMARKABLE COLONIC MUCOSA.   B. COLON, LEFT; COLD BIOPSY:  - UNREMARKABLE COLONIC MUCOSA.   Past Medical History:  Diagnosis Date   Anxiety    Depression    Hyperlipidemia    Hypertension    IBS (irritable bowel syndrome)    Osteoarthritis     Past Surgical History:  Procedure Laterality Date   ABDOMINAL HYSTERECTOMY     BREAST EXCISIONAL BIOPSY Left 1991   neg   COLONOSCOPY N/A 10/13/2014   Procedure: COLONOSCOPY;  Surgeon: Deveron Fly, MD;  Location: [facility] ENDOSCOPY;  Service: Endoscopy;  Laterality: N/A;   COLONOSCOPY WITH PROPOFOL  N/A 12/13/2019   Procedure: COLONOSCOPY WITH PROPOFOL ;  Surgeon: Selena Daily, MD;  Location: [facility] ENDOSCOPY;  Service: Gastroenterology;  Laterality: N/A;   facial orbital reconstruction      Current Outpatient Medications:    Acetaminophen  500 MG capsule, Take by mouth., Disp: , Rfl:    Cholecalciferol  (VITAMIN D ) 50 MCG (2000 UT) tablet, Take 2,000 Units by mouth daily., Disp: , Rfl:    desonide (DESOWEN) 0.05 % cream, Apply topically., Disp: , Rfl:    escitalopram  (LEXAPRO ) 20 MG tablet, Take 20 mg by mouth daily. , Disp: , Rfl:    gabapentin (NEURONTIN) 300 MG capsule, Take 300 mg by mouth 2 (two) times daily., Disp: , Rfl:     ibuprofen (ADVIL) 600 MG tablet, Take by mouth., Disp: , Rfl:    levothyroxine  (SYNTHROID ) 25 MCG tablet, Take 25 mcg by mouth daily. , Disp: , Rfl:    lisinopril (ZESTRIL) 10 MG tablet, Take 10 mg by mouth daily., Disp: , Rfl:    LORazepam  (ATIVAN ) 1 MG tablet, Take 1 mg by mouth 2 (two) times daily as needed., Disp: , Rfl:    lubiprostone  (AMITIZA ) 24 MCG capsule, Take 1 capsule (24 mcg total) by mouth 2 (two) times daily with a meal., Disp: 180 capsule, Rfl: 3   meloxicam (MOBIC) 15 MG tablet, Take 15 mg by mouth daily., Disp: , Rfl:    montelukast  (SINGULAIR ) 10 MG tablet, Take 10 mg by mouth at bedtime. , Disp: , Rfl:    Multiple Vitamin (MULTI-VITAMIN) tablet, Take 1 tablet by mouth daily. , Disp: , Rfl:    polyethylene glycol powder (GLYCOLAX/MIRALAX) 17 GM/SCOOP powder, Take by mouth., Disp: , Rfl:    traMADol  (ULTRAM ) 50 MG tablet, Take 50 mg by mouth every  6 (six) hours as needed., Disp: , Rfl:    traZODone (DESYREL) 50 MG tablet, Take 50 mg by mouth at bedtime as needed., Disp: , Rfl:    Family History  Problem Relation Age of Onset   Breast cancer Paternal Aunt    Breast cancer Paternal Aunt    Breast cancer Paternal Aunt    Breast cancer Paternal Aunt      Social History   Tobacco Use   Smoking status: Never   Smokeless tobacco: Never  Vaping Use   Vaping status: Never Used  Substance Use Topics   Alcohol use: Yes    Alcohol/week: 1.0 standard drink of alcohol    Types: 1 Glasses of wine per week    Comment: none last 24hrs   Drug use: Never    Allergies as of 09/02/2023 - Review Complete 09/02/2023  Allergen Reaction Noted   Other Nausea And Vomiting 09/12/2013   Atorvastatin Other (See Comments) 03/09/2018   Metronidazole Nausea Only 09/29/2017   Percocet [oxycodone-acetaminophen ] Nausea And Vomiting 09/13/2014    Review of Systems:    All systems reviewed and negative except where noted in HPI.   Physical Exam:  BP (!) 141/84 (BP Location: Left Arm,  Patient Position: Sitting, Cuff Size: Large)   Pulse 69   Temp 97.7 F (36.5 C) (Oral)   Wt 222 lb (100.7 kg)   BMI 40.60 kg/m  No LMP recorded. Patient has had a hysterectomy.  General:   Alert,  Well-developed, well-nourished, pleasant and cooperative in NAD Head:  Normocephalic and atraumatic. Eyes:  Sclera clear, no icterus.   Conjunctiva pink. Ears:  Normal auditory acuity. Nose:  No deformity, discharge, or lesions. Mouth:  No deformity or lesions,oropharynx pink & moist. Neck:  Supple; no masses or thyromegaly. Lungs:  Respirations even and unlabored.  Clear throughout to auscultation.   No wheezes, crackles, or rhonchi. No acute distress. Heart:  Regular rate and rhythm; no murmurs, clicks, rubs, or gallops. Abdomen:  Normal bowel sounds. Soft, non-tender, obese and non-distended without masses, hepatosplenomegaly or hernias noted.  No guarding or rebound tenderness.   Rectal: Not performed Msk:  Symmetrical without gross deformities. Good, equal movement & strength bilaterally. Pulses:  Normal pulses noted. Extremities:  No clubbing or edema.  No cyanosis. Neurologic:  Alert and oriented x3;  grossly normal neurologically. Skin:  Intact without significant lesions or rashes. No jaundice. Psych:  Alert and cooperative. Normal mood and affect.  Imaging Studies: Reviewed  Assessment and Plan:   Laurie Pennington is a 73 y.o. female with obesity, BMI 43, known history of sigmoid diverticula seen in consultation for chronic constipation associated with left lower quadrant pain and abdominal bloating.  Etiology most likely secondary to dietary habits and sedentary lifestyle, her diet is devoid of fiber.  Also, emotional distress regarding her son with health problems. Continue high-fiber diet, information provided Strongly advised her to cut back on carbonated beverages, juices and sugary drinks, cheese products, red meat Adequate intake of water Start Amitiza  24 mcg 1-2 times  daily Will switch to Amitiza  8 MCG 1-2 times daily   Follow up as needed   Karma Oz, MD

## 2023-09-02 NOTE — Telephone Encounter (Signed)
 The patient called in wanting to speak to a nurse about a referral.

## 2023-09-02 NOTE — Telephone Encounter (Signed)
 Patient is requesting to be seen today---- she is aware that Dr.Vanga is transitioning to [facility] and she plans to follow Dr.Vanga. Please call patient.   Over the weekend she had an episode of IBS with diarrhea.  She reports having dizziness and stomach cramps. She felt faint and vomited. Once she vomits she started  having diarrhea. She states she feels a little better today but she continues to have diarrhea at this time.She states she is not taking anything for the IBS because she is trying to watch her diet and eat right and avoid trigger foods. She mentioned that Dr.Vanga had suggested  amtiza and Linzess and due to cost of the medications she decided to watch her diet.

## 2023-09-03 ENCOUNTER — Telehealth: Payer: Self-pay

## 2023-09-03 NOTE — Telephone Encounter (Signed)
 Medical records request was sent to [facility] to be sent to [facility]

## 2023-11-06 ENCOUNTER — Other Ambulatory Visit (HOSPITAL_COMMUNITY): Payer: Self-pay

## 2024-02-04 ENCOUNTER — Other Ambulatory Visit: Payer: Self-pay | Admitting: Student

## 2024-02-04 DIAGNOSIS — Z1231 Encounter for screening mammogram for malignant neoplasm of breast: Secondary | ICD-10-CM

## 2024-03-06 ENCOUNTER — Other Ambulatory Visit: Payer: Self-pay | Admitting: Medical Genetics

## 2024-03-07 ENCOUNTER — Ambulatory Visit
Admission: RE | Admit: 2024-03-07 | Discharge: 2024-03-07 | Disposition: A | Discharge status: Home / Self Care | Source: Ambulatory Visit | Attending: Student | Admitting: Student

## 2024-03-07 DIAGNOSIS — Z1231 Encounter for screening mammogram for malignant neoplasm of breast: Secondary | ICD-10-CM | POA: Diagnosis present
